# Patient Record
Sex: Female | Born: 1995 | Race: Black or African American | Hispanic: No | Marital: Single | Smoking: Never smoker
Health system: Southern US, Community
[De-identification: ages and names within clinical notes are randomized; demographics above are authoritative.]

## PROBLEM LIST (undated history)

## (undated) DIAGNOSIS — G43909 Migraine, unspecified, not intractable, without status migrainosus: Secondary | ICD-10-CM

---

## 2017-10-12 ENCOUNTER — Encounter (HOSPITAL_COMMUNITY): Payer: Self-pay | Admitting: Emergency Medicine

## 2017-10-12 ENCOUNTER — Ambulatory Visit (HOSPITAL_COMMUNITY)
Admission: EM | Admit: 2017-10-12 | Discharge: 2017-10-12 | Disposition: A | Discharge status: Home / Self Care | Payer: Medicaid Other | Attending: Family Medicine | Admitting: Family Medicine

## 2017-10-12 DIAGNOSIS — R110 Nausea: Secondary | ICD-10-CM | POA: Diagnosis not present

## 2017-10-12 DIAGNOSIS — G43719 Chronic migraine without aura, intractable, without status migrainosus: Secondary | ICD-10-CM | POA: Diagnosis not present

## 2017-10-12 DIAGNOSIS — Z760 Encounter for issue of repeat prescription: Secondary | ICD-10-CM | POA: Diagnosis not present

## 2017-10-12 HISTORY — DX: Migraine, unspecified, not intractable, without status migrainosus: G43.909

## 2017-10-12 MED ORDER — ONDANSETRON HCL 4 MG PO TABS: 4.0000 mg | ORAL_TABLET | Freq: Four times a day (QID) | ORAL | 0 refills | Status: DC

## 2017-10-12 MED ORDER — ELETRIPTAN HYDROBROMIDE 20 MG PO TABS: 20.0000 mg | ORAL_TABLET | ORAL | 0 refills | Status: DC | PRN

## 2017-10-12 MED ORDER — AMITRIPTYLINE HCL 25 MG PO TABS: 25.0000 mg | ORAL_TABLET | Freq: Every day | ORAL | 0 refills | Status: AC

## 2017-10-12 MED ORDER — KETOROLAC TROMETHAMINE 60 MG/2ML IM SOLN
INTRAMUSCULAR | Status: AC
  Filled 2017-10-12: qty 2

## 2017-10-12 MED ORDER — CYCLOBENZAPRINE HCL 5 MG PO TABS: 5.0000 mg | ORAL_TABLET | Freq: Three times a day (TID) | ORAL | 0 refills | Status: AC | PRN

## 2017-10-12 MED ORDER — KETOROLAC TROMETHAMINE 60 MG/2ML IM SOLN
60.0000 mg | Freq: Once | INTRAMUSCULAR | Status: AC
  Administered 2017-10-12: 60 mg via INTRAMUSCULAR

## 2017-10-12 NOTE — ED Provider Notes (Signed)
[facility]    CSN: [id number] Arrival date & time: 10/12/17  1114     History   Chief Complaint Chief Complaint  Patient presents with  . Headache    HPI Toni Ruiz is a 22 y.o. female.   HPI  Patient is here for migraine headache. She has a history of headache medicine for many years.  [facility].  She is been to neurology.  She is been tried on a variety of medicines.  She is unsure of the names of her medicines.  I did look at her [facility] records and was able to locate a couple of them.  She does know that amitriptyline at bedtime was helpful for her.  She does know that a mild muscle relaxer with ibuprofen will sometimes help her.  She does know that she needs a nausea medicine.  Unsure which of the headache prevention medicines worked well for her. This headaches been present off and on for the last couple of days.  She is photophobic.  Noise sensitive.  Headache across her forehead.  No aura.  Nausea but no vomiting.  No head injury.  No infection symptoms.  No numbness or weakness in arms or legs.   Past Medical History:  Diagnosis Date  . Migraine     There are no active problems to display for this patient.   Past Surgical History:  Procedure Laterality Date  . CESAREAN SECTION      OB History   None      Home Medications    Prior to Admission medications   Medication Sig Start Date End Date Taking? Authorizing Provider  amitriptyline (ELAVIL) 25 MG tablet Take 1 tablet (25 mg total) by mouth at bedtime. 10/12/17   Eustace MooreNelson, Aryelle Figg Sue, MD  cyclobenzaprine (FLEXERIL) 5 MG tablet Take 1 tablet (5 mg total) by mouth 3 (three) times daily as needed for muscle spasms. 10/12/17   Eustace MooreNelson, Noach Calvillo Sue, MD  eletriptan (RELPAX) 20 MG tablet Take 1 tablet (20 mg total) by mouth as needed for migraine or headache. May repeat in 2 hours if headache persists or recurs. 10/12/17   Eustace MooreNelson, Avalyn Molino Sue, MD  ondansetron (ZOFRAN) 4 MG tablet Take 1 tablet  (4 mg total) by mouth every 6 (six) hours. 10/12/17   Eustace MooreNelson, Mailey Landstrom Sue, MD    Family History No family history on file. No family history of migraine per patient Social History Social History   Tobacco Use  . Smoking status: Never Smoker  Substance Use Topics  . Alcohol use: Yes    Frequency: Never  . Drug use: Never     Allergies   Aspirin   Review of Systems Review of Systems  Constitutional: Negative for chills and fever.  HENT: Negative for ear pain and sore throat.   Eyes: Positive for photophobia. Negative for pain and visual disturbance.  Respiratory: Negative for cough and shortness of breath.   Cardiovascular: Negative for chest pain and palpitations.  Gastrointestinal: Positive for nausea. Negative for abdominal pain and vomiting.  Genitourinary: Negative for dysuria and hematuria.  Musculoskeletal: Negative for arthralgias and back pain.  Skin: Negative for color change and rash.  Neurological: Positive for headaches. Negative for seizures and syncope.  All other systems reviewed and are negative.    Physical Exam Triage Vital Signs ED Triage Vitals [10/12/17 1127]  Enc Vitals Group     BP 128/70     Pulse Rate 74     Resp 16  Temp 98.2 F (36.8 C)     Temp src      SpO2 100 %     Weight      Height      Head Circumference      Peak Flow      Pain Score      Pain Loc      Pain Edu?      Excl. in GC?    No data found.  Updated Vital Signs BP 128/70   Pulse 74   Temp 98.2 F (36.8 C)   Resp 16   LMP 09/16/2017   SpO2 100%   Visual Acuity Right Eye Distance:   Left Eye Distance:   Bilateral Distance:    Right Eye Near:   Left Eye Near:    Bilateral Near:     Physical Exam  Constitutional: She is oriented to person, place, and time. She appears well-developed and well-nourished. She appears ill. No distress.  Appears uncomfortable.  HENT:  Head: Normocephalic and atraumatic.  Mouth/Throat: Oropharynx is clear and moist.    Eyes: Pupils are equal, round, and reactive to light. Conjunctivae and EOM are normal.  Pupils reactive.  Disks are flat  Neck: Normal range of motion. No neck rigidity.  Cardiovascular: Normal rate and normal heart sounds.  No murmur heard. Pulmonary/Chest: Effort normal and breath sounds normal. No respiratory distress.  Abdominal: Soft. She exhibits no distension.  Musculoskeletal: Normal range of motion. She exhibits no edema.  Lymphadenopathy:    She has no cervical adenopathy.  Neurological: She is alert and oriented to person, place, and time. She displays normal reflexes. Coordination and gait normal.  Skin: Skin is warm and dry.  Psychiatric: She has a normal mood and affect. Her behavior is normal.     UC Treatments / Results  Labs (all labs ordered are listed, but only abnormal results are displayed) Labs Reviewed - No data to display  EKG None  Radiology No results found.  Procedures Procedures (including critical care time)  Medications Ordered in UC Medications  ketorolac (TORADOL) injection 60 mg (has no administration in time range)    Initial Impression / Assessment and Plan / UC Course  I have reviewed the triage vital signs and the nursing notes.  Pertinent labs & imaging results that were available during my care of the patient were reviewed by me and considered in my medical decision making (see chart for details).     History of migraines.  Normal neurologic exam.  We will refill her medications and refer to neurology for follow-up. Final Clinical Impressions(s) / UC Diagnoses   Final diagnoses:  Intractable chronic migraine without aura and without status migrainosus     Discharge Instructions     You need to call the neurologist today to set up an appointment.  Tell them you are referred by the urgent care center. We have given you a shot of Toradol for the pain. I have also gave her Zofran to take as needed for nausea.  I have refilled  your amitriptyline to take at bedtime.  This is to help prevent future headache I have prescribed a migraine pill to take it first sign of migraine You also need to establish with a primary care physician   ED Prescriptions    Medication Sig Dispense Auth. Provider   ondansetron (ZOFRAN) 4 MG tablet Take 1 tablet (4 mg total) by mouth every 6 (six) hours. 12 tablet Eustace Moore, MD  cyclobenzaprine (FLEXERIL) 5 MG tablet Take 1 tablet (5 mg total) by mouth 3 (three) times daily as needed for muscle spasms. 30 tablet Eustace Moore, MD   amitriptyline (ELAVIL) 25 MG tablet Take 1 tablet (25 mg total) by mouth at bedtime. 30 tablet Eustace Moore, MD   eletriptan (RELPAX) 20 MG tablet Take 1 tablet (20 mg total) by mouth as needed for migraine or headache. May repeat in 2 hours if headache persists or recurs. 10 tablet Eustace Moore, MD     Controlled Substance Prescriptions [place] Controlled Substance Registry consulted? Not Applicable   Eustace Moore, MD 10/12/17 [phone]

## 2017-10-12 NOTE — ED Triage Notes (Signed)
Pt c/o headache for the last few days, recently moved here, doesn't remember the meds she takes for her migraine.

## 2017-10-12 NOTE — Discharge Instructions (Signed)
You need to call the neurologist today to set up an appointment.  Tell them you are referred by the urgent care center. We have given you a shot of Toradol for the pain. I have also gave her Zofran to take as needed for nausea.  I have refilled your amitriptyline to take at bedtime.  This is to help prevent future headache I have prescribed a migraine pill to take it first sign of migraine You also need to establish with a primary care physician

## 2017-11-04 ENCOUNTER — Ambulatory Visit (HOSPITAL_COMMUNITY)
Admission: EM | Admit: 2017-11-04 | Discharge: 2017-11-04 | Disposition: A | Discharge status: Home / Self Care | Payer: Medicaid Other | Attending: Family Medicine | Admitting: Family Medicine

## 2017-11-04 ENCOUNTER — Other Ambulatory Visit: Payer: Self-pay

## 2017-11-04 ENCOUNTER — Encounter (HOSPITAL_COMMUNITY): Payer: Self-pay | Admitting: Emergency Medicine

## 2017-11-04 DIAGNOSIS — Z711 Person with feared health complaint in whom no diagnosis is made: Secondary | ICD-10-CM | POA: Diagnosis not present

## 2017-11-04 DIAGNOSIS — G43909 Migraine, unspecified, not intractable, without status migrainosus: Secondary | ICD-10-CM | POA: Insufficient documentation

## 2017-11-04 DIAGNOSIS — J029 Acute pharyngitis, unspecified: Secondary | ICD-10-CM

## 2017-11-04 DIAGNOSIS — Z79899 Other long term (current) drug therapy: Secondary | ICD-10-CM | POA: Insufficient documentation

## 2017-11-04 DIAGNOSIS — Z113 Encounter for screening for infections with a predominantly sexual mode of transmission: Secondary | ICD-10-CM | POA: Diagnosis not present

## 2017-11-04 NOTE — Discharge Instructions (Addendum)
Declines treatment today.   Vaginal self-swab obtained.  Oral swab obtained HIV/ syphilis testing today If tests are positive, please abstain from sexual activity for at least 7 days and notify partners PCP assistance initiated  Follow up with PCP if symptoms persists Return here or go to ER if you have any new or worsening symptoms

## 2017-11-04 NOTE — ED Provider Notes (Signed)
[facility]   [id number] 11/04/17 Arrival Time: 1056  Cc: sore throat  SUBJECTIVE: History from: patient.  Toni Ruiz is a 22 y.o. female who presents with abrupt onset of sore throat for x 4 days.  Denies positive sick exposure or precipitating event.  Worried for oral STD, but denies oral sex.  Has not tried OTC medications.  Denies alleviating or aggravating factors.  Denies previous symptoms in the past.   Denies fever, chills, fatigue, sinus pain, rhinorrhea, cough, SOB, wheezing, chest pain, nausea, rash, changes in bowel or bladder habits.    Patient also requests STD testing.  Last protected sex in 10/03/17.  Last unprotected sex greater than 1 year ago.  Currrently asymptomatic.  1 female female partner.  Partner without symptoms.  Denies vaginal discharge, vaginal odor, vaginal pain, or urinary symptoms.     ROS: As per HPI.  Past Medical History:  Diagnosis Date  . Migraine    Past Surgical History:  Procedure Laterality Date  . CESAREAN SECTION     Allergies  Allergen Reactions  . Aspirin    No current facility-administered medications on file prior to encounter.    Current Outpatient Medications on File Prior to Encounter  Medication Sig Dispense Refill  . amitriptyline (ELAVIL) 25 MG tablet Take 1 tablet (25 mg total) by mouth at bedtime. 30 tablet 0  . cyclobenzaprine (FLEXERIL) 5 MG tablet Take 1 tablet (5 mg total) by mouth 3 (three) times daily as needed for muscle spasms. 30 tablet 0  . eletriptan (RELPAX) 20 MG tablet Take 1 tablet (20 mg total) by mouth as needed for migraine or headache. May repeat in 2 hours if headache persists or recurs. 10 tablet 0  . ondansetron (ZOFRAN) 4 MG tablet Take 1 tablet (4 mg total) by mouth every 6 (six) hours. 12 tablet 0   Social History   Socioeconomic History  . Marital status: Single    Spouse name: Not on file  . Number of children: Not on file  . Years of education: Not on file  . Highest education level:  Not on file  Occupational History  . Not on file  Social Needs  . Financial resource strain: Not on file  . Food insecurity:    Worry: Not on file    Inability: Not on file  . Transportation needs:    Medical: Not on file    Non-medical: Not on file  Tobacco Use  . Smoking status: Never Smoker  . Smokeless tobacco: Never Used  Substance and Sexual Activity  . Alcohol use: Yes    Frequency: Never  . Drug use: Never  . Sexual activity: Not on file  Lifestyle  . Physical activity:    Days per week: Not on file    Minutes per session: Not on file  . Stress: Not on file  Relationships  . Social connections:    Talks on phone: Not on file    Gets together: Not on file    Attends religious service: Not on file    Active member of club or organization: Not on file    Attends meetings of clubs or organizations: Not on file    Relationship status: Not on file  . Intimate partner violence:    Fear of current or ex partner: Not on file    Emotionally abused: Not on file    Physically abused: Not on file    Forced sexual activity: Not on file  Other Topics Concern  .  Not on file  Social History Narrative  . Not on file   History reviewed. No pertinent family history.  OBJECTIVE:  Vitals:   11/04/17 1121  BP: 110/65  Pulse: 78  Temp: 97.9 F (36.6 C)  TempSrc: Oral  SpO2: 100%     General appearance: alert; oriented; nontoxic appearance  HEENT: Ears: EACs clear, TMs pearly gray with visible cone of light, without erythema; Eyes: PERRL, EOMI grossly; Sinuses nontender to palpation; Nose: no obvious rhinorrhea; Throat: oropharynx clear, tonsils not enlarged or erythematous, uvula midline Lungs: CTA bilaterally without adventitious breath sounds Heart: regular rate and rhythm.  Radial pulses 2+ symmetrical bilaterally Abdomen: soft, non-distended; normal active bowel sounds; non-tender to light or deep palpation; nontender over mcburneys point; no guarding GU:  Deferrred Skin: warm and dry Psychological: alert and cooperative; anxious mood and affect  ASSESSMENT & PLAN:  1. Concern about STD in female without diagnosis   2. Sore throat     No orders of the defined types were placed in this encounter.  Declines treatment today.   Vaginal self-swab obtained.  Oral swab obtained HIV/ syphilis testing today If tests are positive, please abstain from sexual activity for at least 7 days and notify partners PCP assistance initiated  Follow up with PCP if symptoms persists Return here or go to ER if you have any new or worsening symptoms     Reviewed expectations re: course of current medical issues. Questions answered. Outlined signs and symptoms indicating need for more acute intervention. Patient verbalized understanding. After Visit Summary given.        Rennis Harding, PA-C 11/04/17 1252

## 2017-11-04 NOTE — ED Triage Notes (Signed)
Pt reports a sore throat and her mouth "feeling funny" x4 days.  She denies any fever.

## 2017-11-05 LAB — CERVICOVAGINAL ANCILLARY ONLY
Bacterial vaginitis: POSITIVE — AB
CHLAMYDIA, DNA PROBE: NEGATIVE
Candida vaginitis: POSITIVE — AB
NEISSERIA GONORRHEA: NEGATIVE
TRICH (WINDOWPATH): NEGATIVE

## 2017-11-05 LAB — CYTOLOGY, (ORAL, ANAL, URETHRAL) ANCILLARY ONLY
CHLAMYDIA, DNA PROBE: NEGATIVE
NEISSERIA GONORRHEA: NEGATIVE

## 2017-11-05 LAB — RPR: RPR Ser Ql: NONREACTIVE

## 2017-11-05 LAB — HIV ANTIBODY (ROUTINE TESTING W REFLEX): HIV SCREEN 4TH GENERATION: NONREACTIVE

## 2017-11-06 ENCOUNTER — Telehealth (HOSPITAL_COMMUNITY): Payer: Self-pay

## 2017-11-06 MED ORDER — FLUCONAZOLE 150 MG PO TABS: 150.0000 mg | ORAL_TABLET | Freq: Every day | ORAL | 0 refills | Status: AC

## 2017-11-06 MED ORDER — METRONIDAZOLE 500 MG PO TABS: 500.0000 mg | ORAL_TABLET | Freq: Two times a day (BID) | ORAL | 0 refills | Status: DC

## 2017-11-06 NOTE — Telephone Encounter (Signed)
Bacterial vaginosis is positive. This was not treated at the urgent care visit.  Patient complains of persistent symptoms.  Flagyl 500 mg BID x 7 days #14 no refills sent to patients pharmacy of choice per Dr. Murray.  Pt called and made aware of results and new prescription. Answered all questions and pt verbalized understanding.   Pt contacted regarding test for candida (yeast) was positive.  Prescription for fluconazole 150mg po now, repeat dose in 3d if needed, #2 no refills, sent to the pharmacy of record.  Recheck or followup with PCP for further evaluation if symptoms are not improving.  Answered all questions. 

## 2017-12-05 ENCOUNTER — Other Ambulatory Visit: Payer: Self-pay | Admitting: Family Medicine

## 2017-12-10 ENCOUNTER — Encounter (HOSPITAL_COMMUNITY): Payer: Self-pay | Admitting: Emergency Medicine

## 2017-12-10 ENCOUNTER — Other Ambulatory Visit: Payer: Self-pay

## 2017-12-10 ENCOUNTER — Emergency Department (HOSPITAL_COMMUNITY): Payer: Medicaid Other

## 2017-12-10 ENCOUNTER — Emergency Department (HOSPITAL_COMMUNITY)
Admission: EM | Admit: 2017-12-10 | Discharge: 2017-12-11 | Disposition: A | Discharge status: Home / Self Care | Payer: Medicaid Other | Attending: Emergency Medicine | Admitting: Emergency Medicine

## 2017-12-10 DIAGNOSIS — R0602 Shortness of breath: Secondary | ICD-10-CM | POA: Diagnosis not present

## 2017-12-10 DIAGNOSIS — R0789 Other chest pain: Secondary | ICD-10-CM | POA: Diagnosis present

## 2017-12-10 DIAGNOSIS — R112 Nausea with vomiting, unspecified: Secondary | ICD-10-CM | POA: Diagnosis not present

## 2017-12-10 DIAGNOSIS — R42 Dizziness and giddiness: Secondary | ICD-10-CM | POA: Diagnosis not present

## 2017-12-10 DIAGNOSIS — Z5321 Procedure and treatment not carried out due to patient leaving prior to being seen by health care provider: Secondary | ICD-10-CM | POA: Insufficient documentation

## 2017-12-10 LAB — BASIC METABOLIC PANEL
ANION GAP: 8 (ref 5–15)
BUN: 10 mg/dL (ref 6–20)
CALCIUM: 9.3 mg/dL (ref 8.9–10.3)
CO2: 24 mmol/L (ref 22–32)
Chloride: 105 mmol/L (ref 98–111)
Creatinine, Ser: 0.75 mg/dL (ref 0.44–1.00)
GFR calc Af Amer: >60 mL/min (ref >60)
GLUCOSE: 105 mg/dL — AB (ref 70–99)
POTASSIUM: 3.9 mmol/L (ref 3.5–5.1)
Sodium: 137 mmol/L (ref 135–145)

## 2017-12-10 LAB — I-STAT TROPONIN, ED: TROPONIN I, POC: 0 ng/mL (ref 0.00–0.08)

## 2017-12-10 LAB — CBC
HCT: 38.4 % (ref 36.0–46.0)
HEMOGLOBIN: 11.8 g/dL — AB (ref 12.0–15.0)
MCH: 23.3 pg — ABNORMAL LOW (ref 26.0–34.0)
MCHC: 30.7 g/dL (ref 30.0–36.0)
MCV: 75.7 fL — ABNORMAL LOW (ref 78.0–100.0)
Platelets: 213 10*3/uL (ref 150–400)
RBC: 5.07 MIL/uL (ref 3.87–5.11)
RDW: 14.4 % (ref 11.5–15.5)
WBC: 6.2 10*3/uL (ref 4.0–10.5)

## 2017-12-10 LAB — I-STAT BETA HCG BLOOD, ED (MC, WL, AP ONLY)

## 2017-12-10 NOTE — ED Triage Notes (Signed)
Pt c/o left sided chest pain, shortness of breath, nausea/vomiting, and dizziness x 5 days.

## 2017-12-11 ENCOUNTER — Encounter (HOSPITAL_COMMUNITY): Payer: Self-pay

## 2017-12-11 ENCOUNTER — Ambulatory Visit (HOSPITAL_COMMUNITY)
Admission: EM | Admit: 2017-12-11 | Discharge: 2017-12-11 | Disposition: A | Discharge status: Home / Self Care | Payer: Medicaid Other | Attending: Emergency Medicine | Admitting: Emergency Medicine

## 2017-12-11 DIAGNOSIS — R42 Dizziness and giddiness: Secondary | ICD-10-CM | POA: Diagnosis not present

## 2017-12-11 DIAGNOSIS — M7918 Myalgia, other site: Secondary | ICD-10-CM

## 2017-12-11 DIAGNOSIS — M791 Myalgia, unspecified site: Secondary | ICD-10-CM | POA: Diagnosis not present

## 2017-12-11 DIAGNOSIS — R0789 Other chest pain: Secondary | ICD-10-CM

## 2017-12-11 MED ORDER — DEXAMETHASONE SODIUM PHOSPHATE 10 MG/ML IJ SOLN
INTRAMUSCULAR | Status: AC
  Filled 2017-12-11: qty 1

## 2017-12-11 MED ORDER — METHYLPREDNISOLONE 4 MG PO TBPK: ORAL_TABLET | ORAL | 0 refills | Status: AC

## 2017-12-11 MED ORDER — KETOROLAC TROMETHAMINE 60 MG/2ML IM SOLN
INTRAMUSCULAR | Status: AC
  Filled 2017-12-11: qty 2

## 2017-12-11 MED ORDER — DEXAMETHASONE SODIUM PHOSPHATE 10 MG/ML IJ SOLN
10.0000 mg | Freq: Once | INTRAMUSCULAR | Status: AC
  Administered 2017-12-11: 10 mg via INTRAMUSCULAR

## 2017-12-11 MED ORDER — DICLOFENAC SODIUM 50 MG PO TBEC: 50.0000 mg | DELAYED_RELEASE_TABLET | Freq: Three times a day (TID) | ORAL | 0 refills | Status: DC | PRN

## 2017-12-11 MED ORDER — KETOROLAC TROMETHAMINE 60 MG/2ML IM SOLN
60.0000 mg | Freq: Once | INTRAMUSCULAR | Status: AC
  Administered 2017-12-11: 60 mg via INTRAMUSCULAR

## 2017-12-11 NOTE — ED Triage Notes (Signed)
Pt presents with generalized chest pain that radiates around to her back and also makes her left side hurt and have weakness.

## 2017-12-11 NOTE — ED Provider Notes (Signed)
[facility]    CSN: [id number] Arrival date & time: 12/11/17  0847     History   Chief Complaint Chief Complaint  Patient presents with  . Chest Pain    HPI Patches Slatten is a 22 y.o. female.   Subjective:  Marypat Kimmet is a 22 y.o. female who presents for evaluation of chest wall pain, upper back pain and left-sided neck pain. Onset was 5-6 days ago. Symptoms have been worsening since that time. The patient describes the pain as aching and stabbing in the entire chest. Patient rates pain as a 10/10 in intensity. Associated symptoms are: none. Aggravating factors are: coughing, deep inspiration, lifting, palpation of chest and walking. Alleviating factors are: none. Mechanism of injury: none known. Patient reports that she has had this happen before. She was prescribed medications in the past but no longer takes them and doesn't know the name(s) of it either.  She has taken Flexeril and over-the-counter analgesics without any relief in her symptoms.  Notably, the patient presented to the ED last night for the same complaints. She underwent EKG, CXR and blood work but left prior to being seen by a provider.   The following portions of the patient's history were reviewed and updated as appropriate: allergies, current medications, past family history, past medical history, past social history, past surgical history and problem list.       Past Medical History:  Diagnosis Date  . Migraine     There are no active problems to display for this patient.   Past Surgical History:  Procedure Laterality Date  . CESAREAN SECTION      OB History   None      Home Medications    Prior to Admission medications   Medication Sig Start Date End Date Taking? Authorizing Provider  amitriptyline (ELAVIL) 25 MG tablet Take 1 tablet (25 mg total) by mouth at bedtime. 10/12/17   Eustace Moore, MD  cyclobenzaprine (FLEXERIL) 5 MG tablet Take 1 tablet (5 mg total) by mouth 3  (three) times daily as needed for muscle spasms. 10/12/17   Eustace Moore, MD  diclofenac (VOLTAREN) 50 MG EC tablet Take 1 tablet (50 mg total) by mouth 3 (three) times daily as needed. 12/11/17   Lurline Idol, FNP  eletriptan (RELPAX) 20 MG tablet Take 1 tablet (20 mg total) by mouth as needed for migraine or headache. May repeat in 2 hours if headache persists or recurs. 10/12/17   Eustace Moore, MD  methylPREDNISolone (MEDROL DOSEPAK) 4 MG TBPK tablet Take as directed 12/11/17   Lurline Idol, FNP  metroNIDAZOLE (FLAGYL) 500 MG tablet Take 1 tablet (500 mg total) by mouth 2 (two) times daily. 11/06/17   Eustace Moore, MD  ondansetron (ZOFRAN) 4 MG tablet Take 1 tablet (4 mg total) by mouth every 6 (six) hours. 10/12/17   Eustace Moore, MD    Family History History reviewed. No pertinent family history.  Social History Social History   Tobacco Use  . Smoking status: Never Smoker  . Smokeless tobacco: Never Used  Substance Use Topics  . Alcohol use: Yes    Frequency: Never  . Drug use: Never     Allergies   Aspirin   Review of Systems Review of Systems  Respiratory: Positive for chest tightness and shortness of breath.   Cardiovascular: Positive for chest pain.  Gastrointestinal: Positive for nausea and vomiting.  Musculoskeletal: Positive for neck pain.  Neurological: Positive for dizziness and  headaches.  All other systems reviewed and are negative.    Physical Exam Triage Vital Signs ED Triage Vitals [12/11/17 0920]  Enc Vitals Group     BP 109/63     Pulse Rate (!) 181     Resp 14     Temp 98.2 F (36.8 C)     Temp Source Oral     SpO2 99 %     Weight      Height      Head Circumference      Peak Flow      Pain Score      Pain Loc      Pain Edu?      Excl. in GC?    No data found.  Updated Vital Signs BP 109/63 (BP Location: Left Arm)   Pulse (!) 181   Temp 98.2 F (36.8 C) (Oral)   Resp 14   LMP 11/11/2017   SpO2 99%    Visual Acuity Right Eye Distance:   Left Eye Distance:   Bilateral Distance:    Right Eye Near:   Left Eye Near:    Bilateral Near:     Physical Exam  Constitutional: She is oriented to person, place, and time. She appears well-developed and well-nourished.  HENT:  Head: Normocephalic.  Eyes: Pupils are equal, round, and reactive to light. EOM are normal.  Neck: Normal range of motion. Neck supple.  Cardiovascular: Normal rate and regular rhythm.  Pulmonary/Chest: Effort normal and breath sounds normal.  Musculoskeletal: Normal range of motion.  Neurological: She is alert and oriented to person, place, and time. No cranial nerve deficit.  Skin: Skin is warm and dry.  Nursing note and vitals reviewed.    UC Treatments / Results  Labs (all labs ordered are listed, but only abnormal results are displayed) Labs Reviewed - No data to display  EKG None  Radiology Dg Chest 2 View  Result Date: 12/10/2017 CLINICAL DATA:  Chest pain for 5 days EXAM: CHEST - 2 VIEW COMPARISON:  None. FINDINGS: Normal heart size. Lungs clear. No pneumothorax. No pleural effusion. IMPRESSION: No active cardiopulmonary disease. Electronically Signed   By: Jolaine ClickArthur  Hoss M.D.   On: 12/10/2017 22:04    Procedures Procedures (including critical care time)  Medications Ordered in UC Medications  ketorolac (TORADOL) injection 60 mg (60 mg Intramuscular Given 12/11/17 1017)  dexamethasone (DECADRON) injection 10 mg (10 mg Intramuscular Given 12/11/17 1018)    Initial Impression / Assessment and Plan / UC Course  I have reviewed the triage vital signs and the nursing notes.  Pertinent labs & imaging results that were available during my care of the patient were reviewed by me and considered in my medical decision making (see chart for details).    22 year old female presenting with a 5 to 6-day history of chest wall pain, neck pain and back pain.  Denies any trauma or injury. Patient has a diffusely  positive review of systems.  Physical exam positive for chest wall tenderness, left lateral neck tenderness and left upper back tenderness.  No meningism signs or other focal findings are noted.  Patient presented to the ED last night for similar complaints.  Underwent an EKG and blood work but left prior to being seen by provider.  EKG done in the ED showed normal sinus rhythm.  CXR unremarkable. Troponin, CBC and CMP were also unremarkable.  EKG done in the department today revealed a normal sinus rhythm.  Vital signs stable.  Symptoms  likely musculoskeletal in nature.  Will treat with steroids and NSAIDs.  Patient given Toradol 60 mg IM and Decadron 10 mg IM in the clinic.  Prescribed a Medrol Dosepak as well as diclofenac for pain.  Advised patient to follow with her PCP if no improvement after completing the prescribed therapies.  Today's evaluation has revealed no signs of a dangerous process. Discussed diagnosis with patient. Patient aware of their diagnosis, possible red flag symptoms to watch out for and need for close follow up. Patient understands verbal and written discharge instructions. Patient comfortable with plan and disposition.  Patient has a clear mental status at this time, good insight into illness (after discussion and teaching) and has clear judgment to make decisions regarding their care.  Documentation was completed with the aid of voice recognition software. Transcription may contain typographical errors.  Final Clinical Impressions(s) / UC Diagnoses   Final diagnoses:  Chest wall pain  Musculoskeletal pain     Discharge Instructions     Take medications as directed. Follow-up with your primary care doctor if no improvement after completing medications. Go to ED if worse.     ED Prescriptions    Medication Sig Dispense Auth. Provider   methylPREDNISolone (MEDROL DOSEPAK) 4 MG TBPK tablet Take as directed 21 tablet Lurline Idol, FNP   diclofenac (VOLTAREN) 50 MG  EC tablet Take 1 tablet (50 mg total) by mouth 3 (three) times daily as needed. 21 tablet Lurline Idol, FNP     Controlled Substance Prescriptions [place] Controlled Substance Registry consulted? Not Applicable   Lurline Idol, FNP 12/11/17 1024

## 2017-12-11 NOTE — ED Notes (Signed)
No answer for vitals recheck x1 

## 2017-12-11 NOTE — Discharge Instructions (Signed)
Take medications as directed. Follow-up with your primary care doctor if no improvement after completing medications. Go to ED if worse.

## 2017-12-11 NOTE — ED Notes (Signed)
No answer for vitals recheck x3 

## 2017-12-11 NOTE — ED Notes (Signed)
No answer for vitals recheck x2 

## 2017-12-11 NOTE — ED Notes (Signed)
Pt no longer in waiting area.  

## 2017-12-14 ENCOUNTER — Emergency Department (HOSPITAL_COMMUNITY): Payer: Medicaid Other

## 2017-12-14 ENCOUNTER — Other Ambulatory Visit: Payer: Self-pay

## 2017-12-14 ENCOUNTER — Emergency Department (HOSPITAL_COMMUNITY)
Admission: EM | Admit: 2017-12-14 | Discharge: 2017-12-15 | Disposition: A | Discharge status: Home / Self Care | Payer: Medicaid Other | Attending: Emergency Medicine | Admitting: Emergency Medicine

## 2017-12-14 DIAGNOSIS — L02412 Cutaneous abscess of left axilla: Secondary | ICD-10-CM | POA: Diagnosis not present

## 2017-12-14 DIAGNOSIS — Z79899 Other long term (current) drug therapy: Secondary | ICD-10-CM | POA: Diagnosis not present

## 2017-12-14 DIAGNOSIS — R0789 Other chest pain: Secondary | ICD-10-CM | POA: Diagnosis not present

## 2017-12-14 LAB — BASIC METABOLIC PANEL
Anion gap: 8 (ref 5–15)
BUN: 9 mg/dL (ref 6–20)
CO2: 27 mmol/L (ref 22–32)
Calcium: 8.9 mg/dL (ref 8.9–10.3)
Chloride: 105 mmol/L (ref 98–111)
Creatinine, Ser: 0.73 mg/dL (ref 0.44–1.00)
GFR calc Af Amer: >60 mL/min (ref >60)
GFR calc non Af Amer: >60 mL/min (ref >60)
Glucose, Bld: 107 mg/dL — ABNORMAL HIGH (ref 70–99)
Potassium: 3.3 mmol/L — ABNORMAL LOW (ref 3.5–5.1)
Sodium: 140 mmol/L (ref 135–145)

## 2017-12-14 LAB — I-STAT BETA HCG BLOOD, ED (MC, WL, AP ONLY): I-stat hCG, quantitative: <5 m[IU]/mL (ref <5)

## 2017-12-14 LAB — CBC
HCT: 36.1 % (ref 36.0–46.0)
Hemoglobin: 11.2 g/dL — ABNORMAL LOW (ref 12.0–15.0)
MCH: 23.5 pg — AB (ref 26.0–34.0)
MCHC: 31 g/dL (ref 30.0–36.0)
MCV: 75.8 fL — AB (ref 78.0–100.0)
PLATELETS: 219 10*3/uL (ref 150–400)
RBC: 4.76 MIL/uL (ref 3.87–5.11)
RDW: 14.4 % (ref 11.5–15.5)
WBC: 9.1 10*3/uL (ref 4.0–10.5)

## 2017-12-14 LAB — I-STAT TROPONIN, ED: Troponin i, poc: 0 ng/mL (ref 0.00–0.08)

## 2017-12-14 NOTE — ED Triage Notes (Signed)
Patient c/o CP (has been seen for same). Also c/o entire left side pain and difficulty breathing. Patient is in NAD at this time.

## 2017-12-14 NOTE — ED Notes (Signed)
Patient wanted to know if she could have some pain medication while waiting in the lobby; patient advised that we will wait for provider to assess once she gets to a room.

## 2017-12-15 ENCOUNTER — Emergency Department (HOSPITAL_COMMUNITY): Payer: Medicaid Other

## 2017-12-15 LAB — D-DIMER, QUANTITATIVE: D-Dimer, Quant: 0.85 ug/mL-FEU — ABNORMAL HIGH (ref 0.00–0.50)

## 2017-12-15 MED ORDER — ACETAMINOPHEN 325 MG PO TABS
650.0000 mg | ORAL_TABLET | Freq: Once | ORAL | Status: DC
  Filled 2017-12-15: qty 2

## 2017-12-15 MED ORDER — NAPROXEN 500 MG PO TABS: 500.0000 mg | ORAL_TABLET | Freq: Two times a day (BID) | ORAL | 0 refills | Status: DC

## 2017-12-15 MED ORDER — IOPAMIDOL (ISOVUE-370) INJECTION 76%
INTRAVENOUS | Status: AC
  Filled 2017-12-15: qty 100

## 2017-12-15 MED ORDER — IBUPROFEN 400 MG PO TABS
600.0000 mg | ORAL_TABLET | Freq: Once | ORAL | Status: AC
  Administered 2017-12-15: 600 mg via ORAL
  Filled 2017-12-15: qty 1

## 2017-12-15 MED ORDER — KETOROLAC TROMETHAMINE 30 MG/ML IJ SOLN
30.0000 mg | Freq: Once | INTRAMUSCULAR | Status: AC
  Administered 2017-12-15: 30 mg via INTRAMUSCULAR
  Filled 2017-12-15: qty 1

## 2017-12-15 MED ORDER — IOPAMIDOL (ISOVUE-370) INJECTION 76%
100.0000 mL | Freq: Once | INTRAVENOUS | Status: AC | PRN
  Administered 2017-12-15: 100 mL via INTRAVENOUS

## 2017-12-15 MED ORDER — LIDOCAINE-EPINEPHRINE (PF) 2 %-1:200000 IJ SOLN
20.0000 mL | Freq: Once | INTRAMUSCULAR | Status: AC
  Administered 2017-12-15: 20 mL
  Filled 2017-12-15: qty 20

## 2017-12-15 NOTE — Discharge Instructions (Addendum)
You were seen today for chest pain.  Your work-up is reassuring including a CT scan of your chest to rule out PE.  Switch to naproxen to see if this helps with pain.  Follow-up with your primary doctor.

## 2017-12-15 NOTE — ED Provider Notes (Signed)
[facility] EMERGENCY DEPARTMENT Provider Note   CSN: [id number] Arrival date & time: 12/14/17  2125     History   Chief Complaint Chief Complaint  Patient presents with  . Chest Pain    HPI Toni Ruiz is a 22 y.o. female.  HPI  This is a 22 year old female with no significant past medical history who presents with chest pain and "left side pain."  She states that she hurts all over.  She describes left-sided chest pain and pain underneath the breast which is worse with deep breathing.  She at times states that when she cannot catch her breath she has a nonproductive cough.  No fevers.  She has taken ibuprofen and a Medrol Dosepak with minimal relief.  She was seen and evaluated at urgent care.  At that time her pain was thought to be musculoskeletal nature.  She reports leg pain as well as swelling.  No history of blood clots.  No recent travel or hospitalization.  Currently she is unable to quantify her pain because "I have just been in pain all night."  Patient also states that she has noted an abscess under her left axilla which is become more painful.  No fevers or overlying skin changes.  Past Medical History:  Diagnosis Date  . Migraine     There are no active problems to display for this patient.   Past Surgical History:  Procedure Laterality Date  . CESAREAN SECTION       OB History   None      Home Medications    Prior to Admission medications   Medication Sig Start Date End Date Taking? Authorizing Provider  amitriptyline (ELAVIL) 25 MG tablet Take 1 tablet (25 mg total) by mouth at bedtime. 10/12/17  Yes Eustace Moore, MD  cyclobenzaprine (FLEXERIL) 5 MG tablet Take 1 tablet (5 mg total) by mouth 3 (three) times daily as needed for muscle spasms. 10/12/17  Yes Eustace Moore, MD  eletriptan (RELPAX) 20 MG tablet Take 1 tablet (20 mg total) by mouth as needed for migraine or headache. May repeat in 2 hours if headache persists or  recurs. Patient taking differently: Take 20 mg by mouth as needed for headache (for migraines and may repeat once in 2 hours, if no headache persists or recurs).  10/12/17  Yes Eustace Moore, MD  ibuprofen (ADVIL,MOTRIN) 800 MG tablet Take 800 mg by mouth every 8 (eight) hours as needed (for headaches or pain).    Yes [provider]  methylPREDNISolone (MEDROL DOSEPAK) 4 MG TBPK tablet Take as directed 12/11/17  Yes Lurline Idol, FNP  rizatriptan (MAXALT) 10 MG tablet Take 10 mg by mouth as needed (for migraines and may repeat once in 2 hours, if no relief).    Yes [provider]  metroNIDAZOLE (FLAGYL) 500 MG tablet Take 1 tablet (500 mg total) by mouth 2 (two) times daily. Patient not taking: Reported on 12/15/2017 11/06/17   Eustace Moore, MD  naproxen (NAPROSYN) 500 MG tablet Take 1 tablet (500 mg total) by mouth 2 (two) times daily. 12/15/17   Mohamud Mrozek, Mayer Masker, MD  ondansetron (ZOFRAN) 4 MG tablet Take 1 tablet (4 mg total) by mouth every 6 (six) hours. Patient not taking: Reported on 12/15/2017 10/12/17   Eustace Moore, MD    Family History No family history on file.  Social History Social History   Tobacco Use  . Smoking status: Never Smoker  . Smokeless tobacco:  Never Used  Substance Use Topics  . Alcohol use: Yes    Frequency: Never  . Drug use: Never     Allergies   Aspirin; Tylenol [acetaminophen]; and Latex   Review of Systems Review of Systems  Constitutional: Negative for fever.  Respiratory: Positive for cough. Negative for shortness of breath.   Cardiovascular: Positive for chest pain and leg swelling.  Gastrointestinal: Negative for abdominal pain, nausea and vomiting.  Genitourinary: Negative for dysuria.  Musculoskeletal: Negative for back pain.  All other systems reviewed and are negative.    Physical Exam Updated Vital Signs BP (!) 117/54   Pulse 73   Temp 98.8 F (37.1 C) (Oral)   Resp 17   Ht 1.651 m (5\' 5" )    Wt 57.6 kg   SpO2 100%   BMI 21.13 kg/m   Physical Exam  Constitutional: She is oriented to person, place, and time. She appears well-developed and well-nourished. No distress.  HENT:  Head: Normocephalic and atraumatic.  Neck: Neck supple.  Cardiovascular: Normal rate, regular rhythm, normal heart sounds and normal pulses.  Pulmonary/Chest: Effort normal. No respiratory distress. She has no wheezes.  Abdominal: Soft. Bowel sounds are normal.  Musculoskeletal:       Right lower leg: She exhibits tenderness. She exhibits no edema.       Left lower leg: She exhibits tenderness. She exhibits no edema.  Neurological: She is alert and oriented to person, place, and time.  Skin: Skin is warm and dry.  2 x 3 cm area of fluctuance left axilla, no significant overlying erythema  Psychiatric: She has a normal mood and affect.  Nursing note and vitals reviewed.    ED Treatments / Results  Labs (all labs ordered are listed, but only abnormal results are displayed) Labs Reviewed  BASIC METABOLIC PANEL - Abnormal; Notable for the following components:      Result Value   Potassium 3.3 (*)    Glucose, Bld 107 (*)    All other components within normal limits  CBC - Abnormal; Notable for the following components:   Hemoglobin 11.2 (*)    MCV 75.8 (*)    MCH 23.5 (*)    All other components within normal limits  D-DIMER, QUANTITATIVE (NOT AT [facility]) - Abnormal; Notable for the following components:   D-Dimer, Quant 0.85 (*)    All other components within normal limits  I-STAT TROPONIN, ED  I-STAT BETA HCG BLOOD, ED ([facility], [facility], [facility] ONLY)    EKG EKG Interpretation  Date/Time:  Monday December 14 2017 21:38:18 EDT Ventricular Rate:  94 PR Interval:  142 QRS Duration: 88 QT Interval:  354 QTC Calculation: 442 R Axis:   84 Text Interpretation:  Normal sinus rhythm Normal ECG Baseline wander Confirmed by Ross MarcusHorton, Monicka Cyran ([id number]) on 12/15/2017 4:19:07 AM   Radiology Dg Chest 2  View  Result Date: 12/14/2017 CLINICAL DATA:  Chest pain EXAM: CHEST - 2 VIEW COMPARISON:  12/10/2017 FINDINGS: The heart size and mediastinal contours are within normal limits. Both lungs are clear. The visualized skeletal structures are unremarkable. IMPRESSION: No active cardiopulmonary disease. Electronically Signed   By: Jasmine PangKim  Fujinaga M.D.   On: 12/14/2017 22:16   Ct Angio Chest Pe W And/or Wo Contrast  Result Date: 12/15/2017 CLINICAL DATA:  Positive D-dimer. EXAM: CT ANGIOGRAPHY CHEST WITH CONTRAST TECHNIQUE: Multidetector CT imaging of the chest was performed using the standard protocol during bolus administration of intravenous contrast. Multiplanar CT image reconstructions and MIPs were obtained to  evaluate the vascular anatomy. CONTRAST:  ISOVUE-370 IOPAMIDOL (ISOVUE-370) INJECTION 76% COMPARISON:  None. FINDINGS: Cardiovascular: There is good opacification of the central and segmental pulmonary arteries. No focal filling defects. No evidence of significant pulmonary embolus. Normal heart size. No pericardial effusion. Normal caliber thoracic aorta. Mediastinum/Nodes: Esophagus is decompressed. No significant mediastinal lymphadenopathy. Lungs/Pleura: Lungs are clear and expanded. No airspace disease or consolidation. No pleural effusions. No pneumothorax. Airways are patent. Upper Abdomen: No acute abnormality. Musculoskeletal: No chest wall abnormality. No acute or significant osseous findings. Review of the MIP images confirms the above findings. IMPRESSION: No evidence of significant pulmonary embolus. No evidence of active pulmonary disease. Electronically Signed   By: Burman Nieves M.D.   On: 12/15/2017 06:40    Procedures Procedures (including critical care time)  Medications Ordered in ED Medications  iopamidol (ISOVUE-370) 76 % injection (has no administration in time range)  acetaminophen (TYLENOL) tablet 650 mg (650 mg Oral Refused 12/15/17 0615)  lidocaine-EPINEPHrine  (XYLOCAINE W/EPI) 2 %-1:200000 (PF) injection 20 mL (20 mLs Infiltration Given 12/15/17 0443)  ketorolac (TORADOL) 30 MG/ML injection 30 mg (30 mg Intramuscular Given 12/15/17 0443)  iopamidol (ISOVUE-370) 76 % injection 100 mL (100 mLs Intravenous Contrast Given 12/15/17 0622)  ibuprofen (ADVIL,MOTRIN) tablet 600 mg (600 mg Oral Given 12/15/17 4098)     Initial Impression / Assessment and Plan / ED Course  I have reviewed the triage vital signs and the nursing notes.  Pertinent labs & imaging results that were available during my care of the patient were reviewed by me and considered in my medical decision making (see chart for details).     Presents with ongoing chest pain and left-sided pain.  She is overall nontoxic-appearing and vital signs are reassuring.  She is 100% on room air.  She is currently on a Medrol Dosepak as well as anti-inflammatories.  She is not in any acute distress.  Her exam is very atypical.  She is low risk for PE; however, given the pleuritic nature of pain, will send a screening d-dimer.  She denies any recent infectious symptoms or fevers.  Doubt pericarditis or myocarditis.  EKG is reassuring.  D-dimer is positive.  Patient sent for CT.  CT shows no pulmonary embolus and is otherwise normal.  Patient was reassured.  Will switch from diclofenac to naproxen and have her follow-up closely with her primary physician.  After history, exam, and medical workup I feel the patient has been appropriately medically screened and is safe for discharge home. Pertinent diagnoses were discussed with the patient. Patient was given return precautions.   Final Clinical Impressions(s) / ED Diagnoses   Final diagnoses:  Atypical chest pain    ED Discharge Orders         Ordered    naproxen (NAPROSYN) 500 MG tablet  2 times daily     12/15/17 0647           Jorah Hua, Mayer Masker, MD 12/15/17 [phone]

## 2017-12-15 NOTE — ED Provider Notes (Signed)
..  Incision and Drainage Date/Time: 12/15/2017 4:53 AM Performed by: Anselm PancoastJoy, Anamari Galeas C, PA-C Authorized by: Anselm PancoastJoy, Arianie Couse C, PA-C   Consent:    Consent obtained:  Verbal   Consent given by:  Patient   Risks discussed:  Bleeding, incomplete drainage and pain Location:    Type:  Abscess   Size:  2cm   Location:  Upper extremity   Upper extremity location:  Arm   Arm location:  L upper arm (Axilla) Pre-procedure details:    Skin preparation:  Betadine Anesthesia (see MAR for exact dosages):    Anesthesia method:  Local infiltration   Local anesthetic:  Lidocaine 2% WITH epi Procedure type:    Complexity:  Simple Procedure details:    Incision types:  Cruciate   Incision depth:  Subcutaneous   Scalpel blade:  11   Wound management:  Irrigated with saline and extensive cleaning   Drainage:  Purulent   Drainage amount:  Moderate   Wound treatment:  Wound left open Post-procedure details:    Patient tolerance of procedure:  Tolerated well, no immediate complications  US bedside Date/Time: 12/15/2017 4:40 AM Performed by: Anselm PancoastJoy, Benjamin Merrihew C, PA-C Authorized by: Anselm PancoastJoy, Adamaris King C, PA-C  Consent: Verbal consent obtained. Risks and benefits: risks, benefits and alternatives were discussed Consent given by: patient Patient understanding: patient states understanding of the procedure being performed Patient identity confirmed: verbally with patient and provided demographic data Local anesthesia used: no  Anesthesia: Local anesthesia used: no  Sedation: Patient sedated: no  Patient tolerance: Patient tolerated the procedure well with no immediate complications     EMERGENCY DEPARTMENT US SOFT TISSUE INTERPRETATION "Study: Limited Soft Tissue Ultrasound"  INDICATIONS: Pain and Soft tissue infection Multiple views of the body part were obtained in real-time with a multi-frequency linear probe  PERFORMED BY: Myself IMAGES ARCHIVED?: Yes SIDE:Left BODY PART:Axilla INTERPRETATION:  Abcess  present      Anselm PancoastJoy, Shameek Nyquist C, PA-C 12/15/17 0507    Shon BatonHorton, Courtney F, MD 12/15/17 [phone]

## 2018-01-15 ENCOUNTER — Ambulatory Visit (HOSPITAL_COMMUNITY)
Admission: EM | Admit: 2018-01-15 | Discharge: 2018-01-15 | Disposition: A | Discharge status: Home / Self Care | Payer: Medicaid Other | Attending: Family Medicine | Admitting: Family Medicine

## 2018-01-15 ENCOUNTER — Other Ambulatory Visit: Payer: Self-pay

## 2018-01-15 ENCOUNTER — Encounter (HOSPITAL_COMMUNITY): Payer: Self-pay | Admitting: Emergency Medicine

## 2018-01-15 DIAGNOSIS — H938X1 Other specified disorders of right ear: Secondary | ICD-10-CM | POA: Diagnosis not present

## 2018-01-15 DIAGNOSIS — H9201 Otalgia, right ear: Secondary | ICD-10-CM

## 2018-01-15 DIAGNOSIS — Z3202 Encounter for pregnancy test, result negative: Secondary | ICD-10-CM

## 2018-01-15 LAB — POCT PREGNANCY, URINE: Preg Test, Ur: NEGATIVE

## 2018-01-15 MED ORDER — NAPROXEN 500 MG PO TABS: 500.0000 mg | ORAL_TABLET | Freq: Two times a day (BID) | ORAL | 0 refills | Status: DC

## 2018-01-15 MED ORDER — MUPIROCIN CALCIUM 2 % EX CREA: 1.0000 "application " | TOPICAL_CREAM | Freq: Two times a day (BID) | CUTANEOUS | 0 refills | Status: AC

## 2018-01-15 NOTE — Discharge Instructions (Addendum)
Pregnancy  test was negative We will treat the sore in your ear with Bactroban cream You can also do warm compresses to the ear for 10 to 15 minutes a few times a day This may help soften the bump up and possibly drain You can take naproxen twice daily for pain Follow up as needed for continued or worsening symptoms

## 2018-01-15 NOTE — ED Triage Notes (Signed)
Pt here for right ear pain x3 days.  Pt denies any other URI symptoms.  Pt is also wanting a pregnancy test today.

## 2018-01-18 NOTE — ED Provider Notes (Signed)
[facility]    CSN: [id number] Arrival date & time: 01/15/18  1428     History   Chief Complaint Chief Complaint  Patient presents with  . Otalgia    Right  . Possible Pregnancy    Test    HPI Toni Ruiz is a 22 y.o. female.   Pt is a 22 year old female that is here for multiple complaints. She has bump to the inside of ear that has been there for a couple of days. It is painful. Symptoms have been constant and remained the same. She denies drainage from the area. She hasn't done anything to treat her symptoms. She denies any associated fever, chill, body aches, fatigue.   Pt is also here wanting pregnancy test. Her LMP was 12/14/17 and normal. She denies any pregnancy type symptoms. She denies any vaginal discharge, bleeding, abd pain, pelvic pain or dysuria.      Past Medical History:  Diagnosis Date  . Migraine     There are no active problems to display for this patient.   Past Surgical History:  Procedure Laterality Date  . CESAREAN SECTION      OB History   None      Home Medications    Prior to Admission medications   Medication Sig Start Date End Date Taking? Authorizing Provider  cyclobenzaprine (FLEXERIL) 5 MG tablet Take 1 tablet (5 mg total) by mouth 3 (three) times daily as needed for muscle spasms. 10/12/17  Yes Eustace Moore, MD  ibuprofen (ADVIL,MOTRIN) 800 MG tablet Take 800 mg by mouth every 8 (eight) hours as needed (for headaches or pain).    Yes [provider]  amitriptyline (ELAVIL) 25 MG tablet Take 1 tablet (25 mg total) by mouth at bedtime. 10/12/17   Eustace Moore, MD  eletriptan (RELPAX) 20 MG tablet Take 1 tablet (20 mg total) by mouth as needed for migraine or headache. May repeat in 2 hours if headache persists or recurs. Patient taking differently: Take 20 mg by mouth as needed for headache (for migraines and may repeat once in 2 hours, if no headache persists or recurs).  10/12/17   Eustace Moore, MD  methylPREDNISolone (MEDROL DOSEPAK) 4 MG TBPK tablet Take as directed 12/11/17   Lurline Idol, FNP  metroNIDAZOLE (FLAGYL) 500 MG tablet Take 1 tablet (500 mg total) by mouth 2 (two) times daily. Patient not taking: Reported on 12/15/2017 11/06/17   Eustace Moore, MD  mupirocin cream (BACTROBAN) 2 % Apply 1 application topically 2 (two) times daily. 01/15/18   Dahlia Byes A, NP  naproxen (NAPROSYN) 500 MG tablet Take 1 tablet (500 mg total) by mouth 2 (two) times daily with a meal. 01/15/18   Hellon Vaccarella A, NP  ondansetron (ZOFRAN) 4 MG tablet Take 1 tablet (4 mg total) by mouth every 6 (six) hours. Patient not taking: Reported on 12/15/2017 10/12/17   Eustace Moore, MD  rizatriptan (MAXALT) 10 MG tablet Take 10 mg by mouth as needed (for migraines and may repeat once in 2 hours, if no relief).     [provider]    Family History History reviewed. No pertinent family history.  Social History Social History   Tobacco Use  . Smoking status: Never Smoker  . Smokeless tobacco: Never Used  Substance Use Topics  . Alcohol use: Yes    Frequency: Never  . Drug use: Never     Allergies   Aspirin; Tylenol [acetaminophen]; and  Latex   Review of Systems Review of Systems   Physical Exam Triage Vital Signs ED Triage Vitals  Enc Vitals Group     BP 01/15/18 1459 115/76     Pulse Rate 01/15/18 1459 81     Resp 01/15/18 1459 16     Temp 01/15/18 1459 98.8 F (37.1 C)     Temp Source 01/15/18 1459 Oral     SpO2 01/15/18 1459 99 %     Weight --      Height --      Head Circumference --      Peak Flow --      Pain Score 01/15/18 1525 6     Pain Loc --      Pain Edu? --      Excl. in GC? --    No data found.  Updated Vital Signs BP 115/76 (BP Location: Left Arm)   Pulse 81   Temp 98.8 F (37.1 C) (Oral)   Resp 16   LMP 12/14/2017 (Exact Date)   SpO2 99%   Visual Acuity Right Eye Distance:   Left Eye Distance:   Bilateral Distance:     Right Eye Near:   Left Eye Near:    Bilateral Near:     Physical Exam  Constitutional: She is oriented to person, place, and time. She appears well-developed and well-nourished.  Very pleasant. Non toxic or ill appearing.   HENT:  Head: Normocephalic and atraumatic.  Nose: Nose normal.  Mouth/Throat: Oropharynx is clear and moist.  Small bump noted to right ear near tragus. Tender to touch. slightly erythematous. No drainage. No fluctuance.  Bilateral TMs normal.   Eyes: Conjunctivae are normal.  Neck: Normal range of motion.  Cardiovascular: Normal rate, regular rhythm and normal heart sounds.  Pulmonary/Chest: Effort normal and breath sounds normal.  Abdominal: Soft. Bowel sounds are normal.  Abdomen soft, non tender. No CVA tenderness. No rebound tenderness.   Musculoskeletal: Normal range of motion.  Neurological: She is alert and oriented to person, place, and time.  Skin: Skin is warm and dry.  Psychiatric: She has a normal mood and affect.  Nursing note and vitals reviewed.    UC Treatments / Results  Labs (all labs ordered are listed, but only abnormal results are displayed) Labs Reviewed  POCT PREGNANCY, URINE    EKG None  Radiology No results found.  Procedures Procedures (including critical care time)  Medications Ordered in UC Medications - No data to display  Initial Impression / Assessment and Plan / UC Course  I have reviewed the triage vital signs and the nursing notes.  Pertinent labs & imaging results that were available during my care of the patient were reviewed by me and considered in my medical decision making (see chart for details).     We will do warm compresses to the abscess in the ear. Bactroban cream Naproxen for pain Monitor for worsening symptoms.  Pregnancy test negative Follow up as needed for continued or worsening symptoms  Final Clinical Impressions(s) / UC Diagnoses   Final diagnoses:  Right ear pain  Pregnancy  test negative     Discharge Instructions     Pregnancy  test was negative We will treat the sore in your ear with Bactroban cream You can also do warm compresses to the ear for 10 to 15 minutes a few times a day This may help soften the bump up and possibly drain You can take naproxen twice daily for pain  Follow up as needed for continued or worsening symptoms     ED Prescriptions    Medication Sig Dispense Auth. Provider   naproxen (NAPROSYN) 500 MG tablet Take 1 tablet (500 mg total) by mouth 2 (two) times daily with a meal. 30 tablet Jamarcus Laduke A, NP   mupirocin cream (BACTROBAN) 2 % Apply 1 application topically 2 (two) times daily. 15 g Dahlia Byes A, NP     Controlled Substance Prescriptions [place] Controlled Substance Registry consulted? Not Applicable   Janace Aris, NP 01/18/18 1047

## 2018-03-09 ENCOUNTER — Encounter (HOSPITAL_COMMUNITY): Payer: Self-pay | Admitting: Emergency Medicine

## 2018-03-09 ENCOUNTER — Other Ambulatory Visit: Payer: Self-pay

## 2018-03-09 ENCOUNTER — Ambulatory Visit (HOSPITAL_COMMUNITY)
Admission: EM | Admit: 2018-03-09 | Discharge: 2018-03-09 | Disposition: A | Discharge status: Home / Self Care | Payer: Medicaid Other | Attending: Physician Assistant | Admitting: Physician Assistant

## 2018-03-09 DIAGNOSIS — R0789 Other chest pain: Secondary | ICD-10-CM | POA: Insufficient documentation

## 2018-03-09 MED ORDER — METRONIDAZOLE 0.75 % VA GEL: 1.0000 | Freq: Two times a day (BID) | VAGINAL | 0 refills | Status: AC

## 2018-03-09 MED ORDER — ESCITALOPRAM OXALATE 5 MG PO TABS: 5.0000 mg | ORAL_TABLET | Freq: Every day | ORAL | 0 refills | Status: AC

## 2018-03-09 NOTE — ED Triage Notes (Addendum)
PT reports a history of heart problems. [facility]. No history of PE per PT. PT reports left sided chest pain and pain with breathing for 1.5 hours. PT asks for anxiety medicine, her heart doctor would not give her anxiety medicine.   Also requests testing for BV.   PT saw her heart doctor recently and was told her "blood was low" PT was prescribed iron, but has not started taking it.

## 2018-03-09 NOTE — Discharge Instructions (Addendum)
You could consider asking your primary care provider to test you for sickle cell disease.  This can cause transient chest pain.  This may also be cervical angina which is quite common and a neck x-ray may shed some light here as well.  I am treating you for an anxiety related disorder with an SSRI called Lexapro.  The maximum dose of this medication is 20 mg and I am prescribing 5 mg.  Please get back in with your primary care provider in the next 2 to 3 weeks so you can see how this medication is affecting the and the dose can be adjusted at that time.

## 2018-03-09 NOTE — ED Provider Notes (Signed)
03/09/2018 3:56 PM   DOB: Jul 09, 1995 / MRN: [id number]  SUBJECTIVE:  Toni Ruiz is a 22 y.o. female presenting for chest pain.  She is a non-smoker and has no history of diabetes, hypertension, dyslipidemia.  She had a negative work-up at [facility] earlier this year by cardiology.  Reports that she has been having chest pain for quite a long time.  Pain tends to be left-sided and sometimes with a radicular pattern into the left shoulder.  She associates neck pain with this as well.  She takes ibuprofen 800 mg and this seems to help.  She denies shortness of breath, DOE, nausea diaphoresis, dizziness.  She does associate an increase in chest pain when she feels increases in emotional stress.  She has a history of anxiety that has required benzodiazepine therapy.   From her cardiology provider at [facility] as of Jan 19: Atypical chest pain however with no risk factors, and essentially benign ER evaluation and normal EKG today, some reproducible component as well, we'll proceed with a treadmill stress test and an echocardiogram to look for any structural abnormalities and for any ischemic substrate although suspect that would be unlikely and most of her symptoms are either musculoskeletal or mediated by anxiety. She will need to follow-up with the family physician's if these tests are normal.   She is allergic to aspirin; tylenol [acetaminophen]; and latex.   She  has a past medical history of Migraine.    She  reports that she has never smoked. She has never used smokeless tobacco. She reports that she drinks alcohol. She reports that she does not use drugs. She  has no sexual activity history on file. The patient  has a past surgical history that includes Cesarean section.  Her family history is not on file.  ROS HPI  OBJECTIVE:  BP 116/76   Pulse 94   Temp 98.2 F (36.8 C)   Resp 16   LMP 02/16/2018   SpO2 100%   Wt Readings from Last 3 Encounters:  12/14/17 127 lb (57.6 kg)   Temp Readings  from Last 3 Encounters:  03/09/18 98.2 F (36.8 C)  01/15/18 98.8 F (37.1 C) (Oral)  12/15/17 98.8 F (37.1 C) (Oral)   BP Readings from Last 3 Encounters:  03/09/18 116/76  01/15/18 115/76  12/15/17 (!) 117/54   Pulse Readings from Last 3 Encounters:  03/09/18 94  01/15/18 81  12/15/17 73    Physical Exam  Constitutional: She is oriented to person, place, and time. She appears well-nourished. No distress.  Eyes: Pupils are equal, round, and reactive to light. EOM are normal.  Cardiovascular: Normal rate, regular rhythm and normal pulses.  Pulmonary/Chest: Effort normal and breath sounds normal.  Some reproducible tenderness about the chest at the left sternal border.  Abdominal: She exhibits no distension.  Neurological: She is alert and oriented to person, place, and time. No cranial nerve deficit. Gait normal.  Skin: Skin is dry. She is not diaphoretic.  Psychiatric: She has a normal mood and affect.  Vitals reviewed.   No results found for this or any previous visit (from the past 72 hour(s)).  No results found.  ASSESSMENT AND PLAN:   Atypical chest pain    Discharge Instructions     You could consider asking your primary care provider to test you for sickle cell disease.  This can cause transient chest pain.  This may also be cervical angina which is quite common and a neck x-ray may shed  some light here as well.  I am treating you for an anxiety related disorder with an SSRI called Lexapro.  The maximum dose of this medication is 20 mg and I am prescribing 5 mg.  Please get back in with your primary care provider in the next 2 to 3 weeks so you can see how this medication is affecting the and the dose can be adjusted at that time.        The patient is advised to call or return to clinic if she does not see an improvement in symptoms, or to seek the care of the closest emergency department if she worsens with the above plan.   Deliah BostonMichael Clark, MHS,  PA-C 03/09/2018 3:56 PM   Ofilia Neaslark, Michael L, PA-C 03/09/18 1556

## 2018-04-23 ENCOUNTER — Encounter (HOSPITAL_COMMUNITY): Payer: Self-pay | Admitting: Emergency Medicine

## 2018-04-23 ENCOUNTER — Ambulatory Visit (HOSPITAL_COMMUNITY)
Admission: EM | Admit: 2018-04-23 | Discharge: 2018-04-23 | Disposition: A | Discharge status: Home / Self Care | Payer: Medicaid Other | Attending: Family Medicine | Admitting: Family Medicine

## 2018-04-23 DIAGNOSIS — R1084 Generalized abdominal pain: Secondary | ICD-10-CM | POA: Insufficient documentation

## 2018-04-23 DIAGNOSIS — Z3202 Encounter for pregnancy test, result negative: Secondary | ICD-10-CM

## 2018-04-23 DIAGNOSIS — N898 Other specified noninflammatory disorders of vagina: Secondary | ICD-10-CM | POA: Insufficient documentation

## 2018-04-23 DIAGNOSIS — L309 Dermatitis, unspecified: Secondary | ICD-10-CM | POA: Diagnosis not present

## 2018-04-23 LAB — POCT URINALYSIS DIP (DEVICE)
Bilirubin Urine: NEGATIVE
GLUCOSE, UA: NEGATIVE mg/dL
KETONES UR: NEGATIVE mg/dL
Nitrite: NEGATIVE
PH: 7 (ref 5.0–8.0)
Protein, ur: NEGATIVE mg/dL
SPECIFIC GRAVITY, URINE: 1.015 (ref 1.005–1.030)
UROBILINOGEN UA: 1 mg/dL (ref 0.0–1.0)

## 2018-04-23 MED ORDER — TRIAMCINOLONE ACETONIDE 0.1 % EX CREA: 1.0000 "application " | TOPICAL_CREAM | Freq: Two times a day (BID) | CUTANEOUS | 0 refills | Status: AC

## 2018-04-23 MED ORDER — METRONIDAZOLE 500 MG PO TABS: 500.0000 mg | ORAL_TABLET | Freq: Two times a day (BID) | ORAL | 0 refills | Status: AC

## 2018-04-23 NOTE — ED Provider Notes (Addendum)
[facility]    CSN: [id number] Arrival date & time: 04/23/18  1428     History   Chief Complaint Chief Complaint  Patient presents with  . Appointment    245  . Pelvic Pain    HPI Toni Ruiz is a 23 y.o. female no significant past medical history presenting today for evaluation of abdominal pain.  Patient states that over the past week she has had lower abdominal discomfort.  Symptoms began Sunday after she ended her menstrual cycle.  Since she has had some occasional spotting as well as noted some white discharge.  She has had some nausea, but denies vomiting.  States that she has a history of BV and was recently treated with the gel which she does not believe fully resolved her symptoms.  She denies any specific concerns for STDs and denies any new partners.  She is not on any form of birth control.  Denies associated chest pain or shortness of breath.  Eating and drinking relatively normal despite pain.  Patient also noting that she has a history of eczema and has frequent areas of dry erythematous lesions and is hoping to receive a cream for this.  HPI  Past Medical History:  Diagnosis Date  . Migraine     There are no active problems to display for this patient.   Past Surgical History:  Procedure Laterality Date  . CESAREAN SECTION      OB History   No obstetric history on file.      Home Medications    Prior to Admission medications   Medication Sig Start Date End Date Taking? Authorizing Provider  cyclobenzaprine (FLEXERIL) 5 MG tablet Take 1 tablet (5 mg total) by mouth 3 (three) times daily as needed for muscle spasms. 10/12/17  Yes Eustace Moore, MD  amitriptyline (ELAVIL) 25 MG tablet Take 1 tablet (25 mg total) by mouth at bedtime. 10/12/17   Eustace Moore, MD  escitalopram (LEXAPRO) 5 MG tablet Take 1 tablet (5 mg total) by mouth daily. 03/09/18   Ofilia Neas, PA-C  methylPREDNISolone (MEDROL DOSEPAK) 4 MG TBPK tablet Take as  directed Patient not taking: Reported on 04/23/2018 12/11/17   Lurline Idol, FNP  metroNIDAZOLE (FLAGYL) 500 MG tablet Take 1 tablet (500 mg total) by mouth 2 (two) times daily for 7 days. 04/23/18 04/30/18  ,  C, PA-C  metroNIDAZOLE (METROGEL VAGINAL) 0.75 % vaginal gel Place 1 Applicatorful vaginally 2 (two) times daily. Patient not taking: Reported on 04/23/2018 03/09/18   Ofilia Neas, PA-C  mupirocin cream (BACTROBAN) 2 % Apply 1 application topically 2 (two) times daily. 01/15/18   Dahlia Byes A, NP  rizatriptan (MAXALT) 10 MG tablet Take 10 mg by mouth as needed (for migraines and may repeat once in 2 hours, if no relief).     [provider]  triamcinolone cream (KENALOG) 0.1 % Apply 1 application topically 2 (two) times daily. 04/23/18   , Junius Creamer, PA-C    Family History No family history on file.  Social History Social History   Tobacco Use  . Smoking status: Never Smoker  . Smokeless tobacco: Never Used  Substance Use Topics  . Alcohol use: Yes    Frequency: Never  . Drug use: Never     Allergies   Aspirin; Tylenol [acetaminophen]; and Latex   Review of Systems Review of Systems  Constitutional: Negative for fever.  Respiratory: Negative for shortness of breath.   Cardiovascular: Negative for  chest pain.  Gastrointestinal: Positive for abdominal pain. Negative for diarrhea, nausea and vomiting.  Genitourinary: Positive for vaginal discharge. Negative for dysuria, flank pain, genital sores, hematuria, menstrual problem, vaginal bleeding and vaginal pain.  Musculoskeletal: Negative for back pain.  Skin: Negative for rash.  Neurological: Negative for dizziness, light-headedness and headaches.     Physical Exam Triage Vital Signs ED Triage Vitals  Enc Vitals Group     BP 04/23/18 1501 122/66     Pulse Rate 04/23/18 1501 81     Resp 04/23/18 1501 17     Temp 04/23/18 1501 98.2 F (36.8 C)     Temp src --      SpO2 04/23/18  1501 100 %     Weight --      Height --      Head Circumference --      Peak Flow --      Pain Score 04/23/18 1503 3     Pain Loc --      Pain Edu? --      Excl. in GC? --    No data found.  Updated Vital Signs BP 122/66   Pulse 81   Temp 98.2 F (36.8 C)   Resp 17   LMP 04/18/2018   SpO2 100%   Visual Acuity Right Eye Distance:   Left Eye Distance:   Bilateral Distance:    Right Eye Near:   Left Eye Near:    Bilateral Near:     Physical Exam Vitals signs and nursing note reviewed.  Constitutional:      General: She is not in acute distress.    Appearance: She is well-developed.  HENT:     Head: Normocephalic and atraumatic.  Eyes:     Conjunctiva/sclera: Conjunctivae normal.  Neck:     Musculoskeletal: Neck supple.  Cardiovascular:     Rate and Rhythm: Normal rate and regular rhythm.     Heart sounds: No murmur.  Pulmonary:     Effort: Pulmonary effort is normal. No respiratory distress.     Breath sounds: Normal breath sounds.  Abdominal:     Palpations: Abdomen is soft.     Tenderness: There is no abdominal tenderness.     Comments: Mild tenderness to bilateral lower quadrants of abdomen, negative rebound, negative Rovsing, negative McBurney's  Genitourinary:    Comments: Normal external female genitalia, normal pink vaginal lining, cervix nonerythematous, moderate amount of white milky discharge with various thicker aspects of the discharge Skin:    General: Skin is warm and dry.  Neurological:     Mental Status: She is alert.      UC Treatments / Results  Labs (all labs ordered are listed, but only abnormal results are displayed) Labs Reviewed  POCT URINALYSIS DIP (DEVICE) - Abnormal; Notable for the following components:      Result Value   Hgb urine dipstick TRACE (*)    Leukocytes, UA MODERATE (*)    All other components within normal limits  URINE CULTURE  CERVICOVAGINAL ANCILLARY ONLY    EKG None  Radiology No results  found.  Procedures Procedures (including critical care time)  Medications Ordered in UC Medications - No data to display  Initial Impression / Assessment and Plan / UC Course  I have reviewed the triage vital signs and the nursing notes.  Pertinent labs & imaging results that were available during my care of the patient were reviewed by me and considered in my medical decision making (see  chart for details).     Negative pregnancy, moderate leuks, will send off for culture to confirm.  Will initiate treatment for BV given her significant history.  Vaginal swab obtained and will send off to check for other causes of discharge.  Initiating treatment with metronidazole today empirically.  Tylenol and ibuprofen for abdominal pain in the meantime.Discussed strict return precautions. Patient verbalized understanding and is agreeable with plan.  Kenalog prescribed for eczema.  Final Clinical Impressions(s) / UC Diagnoses   Final diagnoses:  Generalized abdominal pain  Vaginal discharge  Eczema, unspecified type     Discharge Instructions     We are sending the urine off to check for infection in the urine Please begin taking metronidazole twice daily for the next week to treat for BV.  We will send the swab off which will come back in a couple days and we will call you with the results and provide any further treatment if needed  In the meantime please take Tylenol or ibuprofen for your discomfort  Follow-up if symptoms not resolving or worsening   ED Prescriptions    Medication Sig Dispense Auth. Provider   metroNIDAZOLE (FLAGYL) 500 MG tablet Take 1 tablet (500 mg total) by mouth 2 (two) times daily for 7 days. 14 tablet ,  C, PA-C   triamcinolone cream (KENALOG) 0.1 % Apply 1 application topically 2 (two) times daily. 453.6 g Patterson Hammersmith,  C, PA-C     Controlled Substance Prescriptions [place] Controlled Substance Registry consulted? Not Applicable   Foy [place],   C, PA-C 04/23/18 2111    Lew Dawes,  C, [place] 04/23/18 2112

## 2018-04-23 NOTE — Discharge Instructions (Signed)
We are sending the urine off to check for infection in the urine Please begin taking metronidazole twice daily for the next week to treat for BV.  We will send the swab off which will come back in a couple days and we will call you with the results and provide any further treatment if needed  In the meantime please take Tylenol or ibuprofen for your discomfort  Follow-up if symptoms not resolving or worsening

## 2018-04-23 NOTE — ED Triage Notes (Signed)
Pt reports lower abd pain radiating upwards and to the left with intermittent nausea x3 days. States she finished period Sunday but continues spotting. Also reports white vaginal discharge since Sunday. States "they gave me something last month but it didn't work."

## 2018-04-26 LAB — URINE CULTURE: Culture: 10000 — AB

## 2018-04-26 LAB — CERVICOVAGINAL ANCILLARY ONLY
BACTERIAL VAGINITIS: POSITIVE — AB
CHLAMYDIA, DNA PROBE: NEGATIVE
Candida vaginitis: NEGATIVE
NEISSERIA GONORRHEA: NEGATIVE
Trichomonas: NEGATIVE

## 2018-05-01 ENCOUNTER — Other Ambulatory Visit (HOSPITAL_COMMUNITY): Payer: Self-pay | Admitting: Physician Assistant

## 2018-05-07 NOTE — Telephone Encounter (Signed)
Please Advise, pt needs to establish care. Provider not at this practice.

## 2019-12-19 IMAGING — CR DG CHEST 2V
2 series · 2 of 2 positions shown · non-contrast
Comparison: 12/10/2017

CLINICAL DATA: Chest pain

EXAM:
CHEST - 2 VIEW

[chest pa]
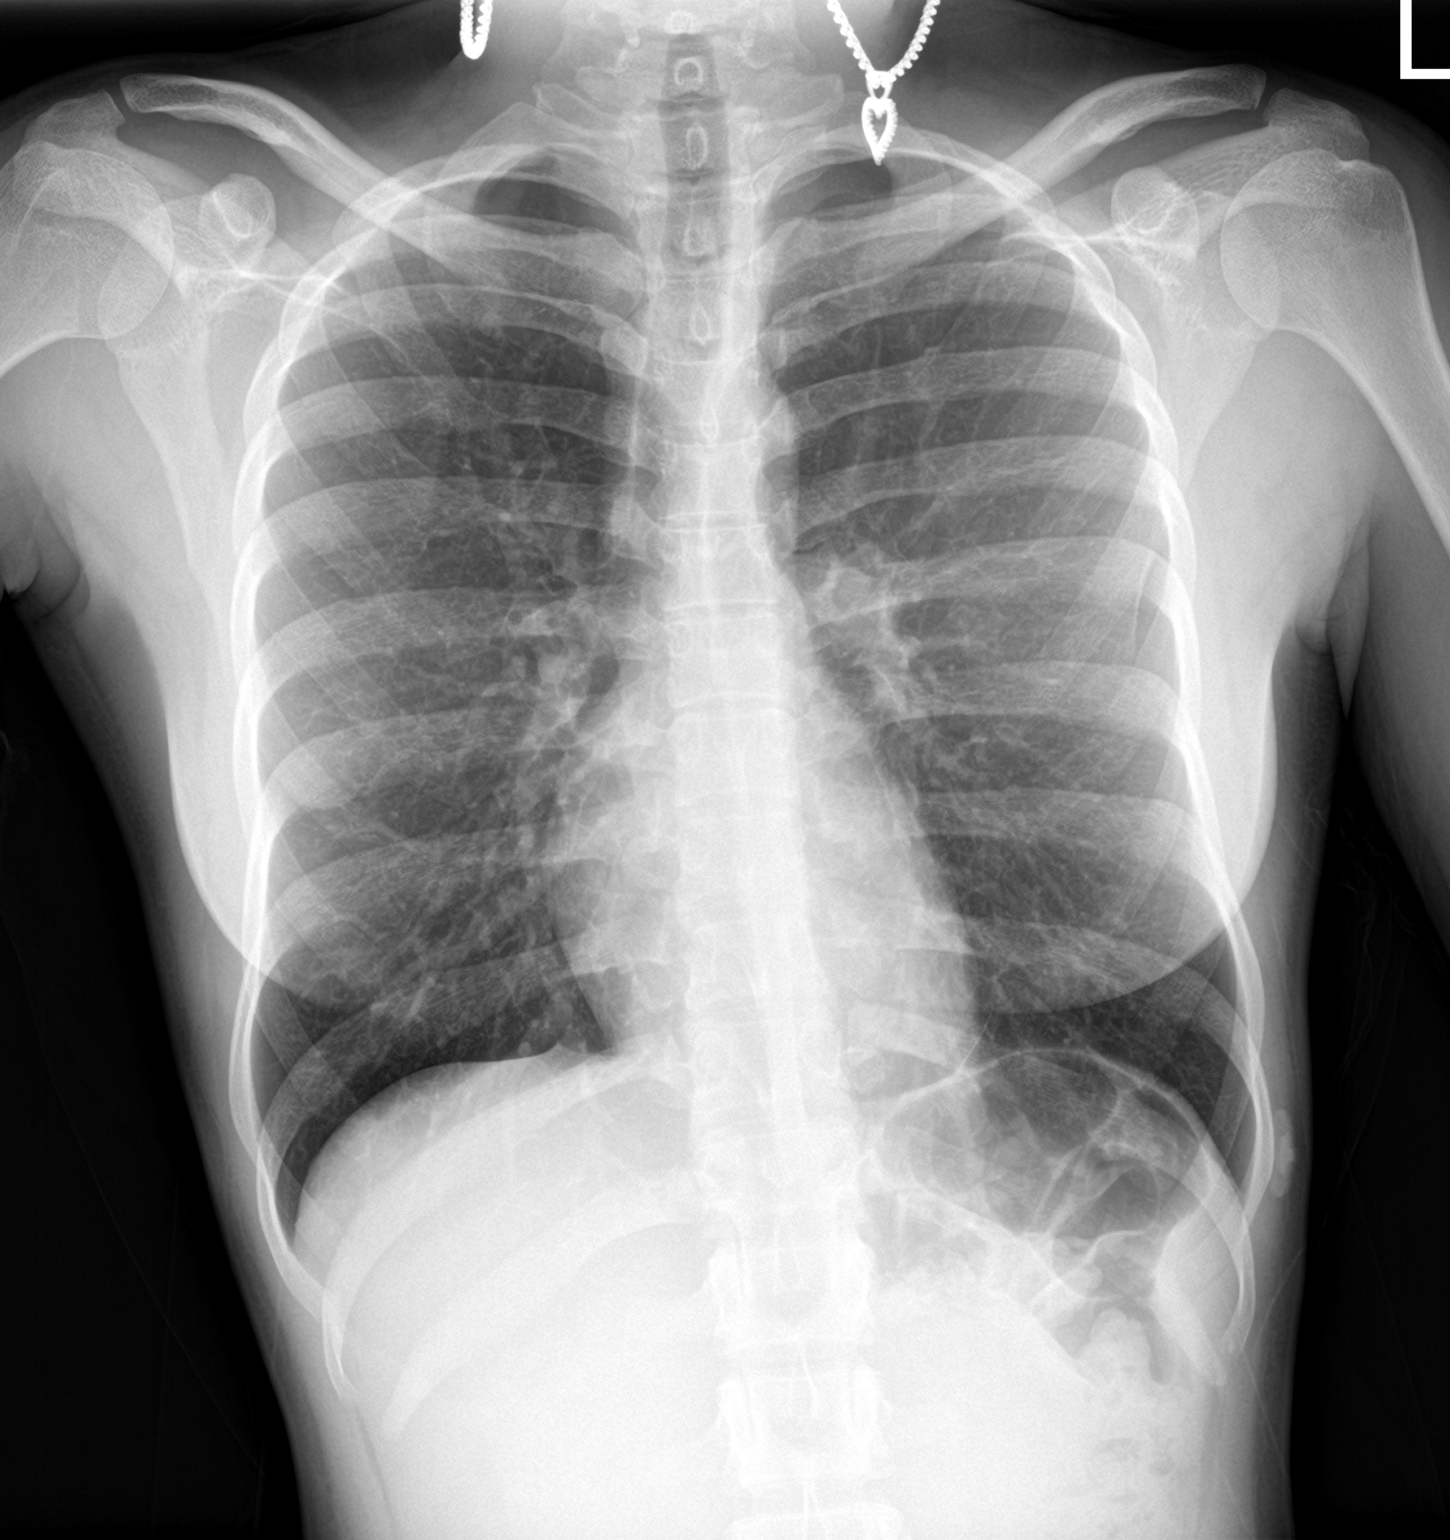

[chest lat]
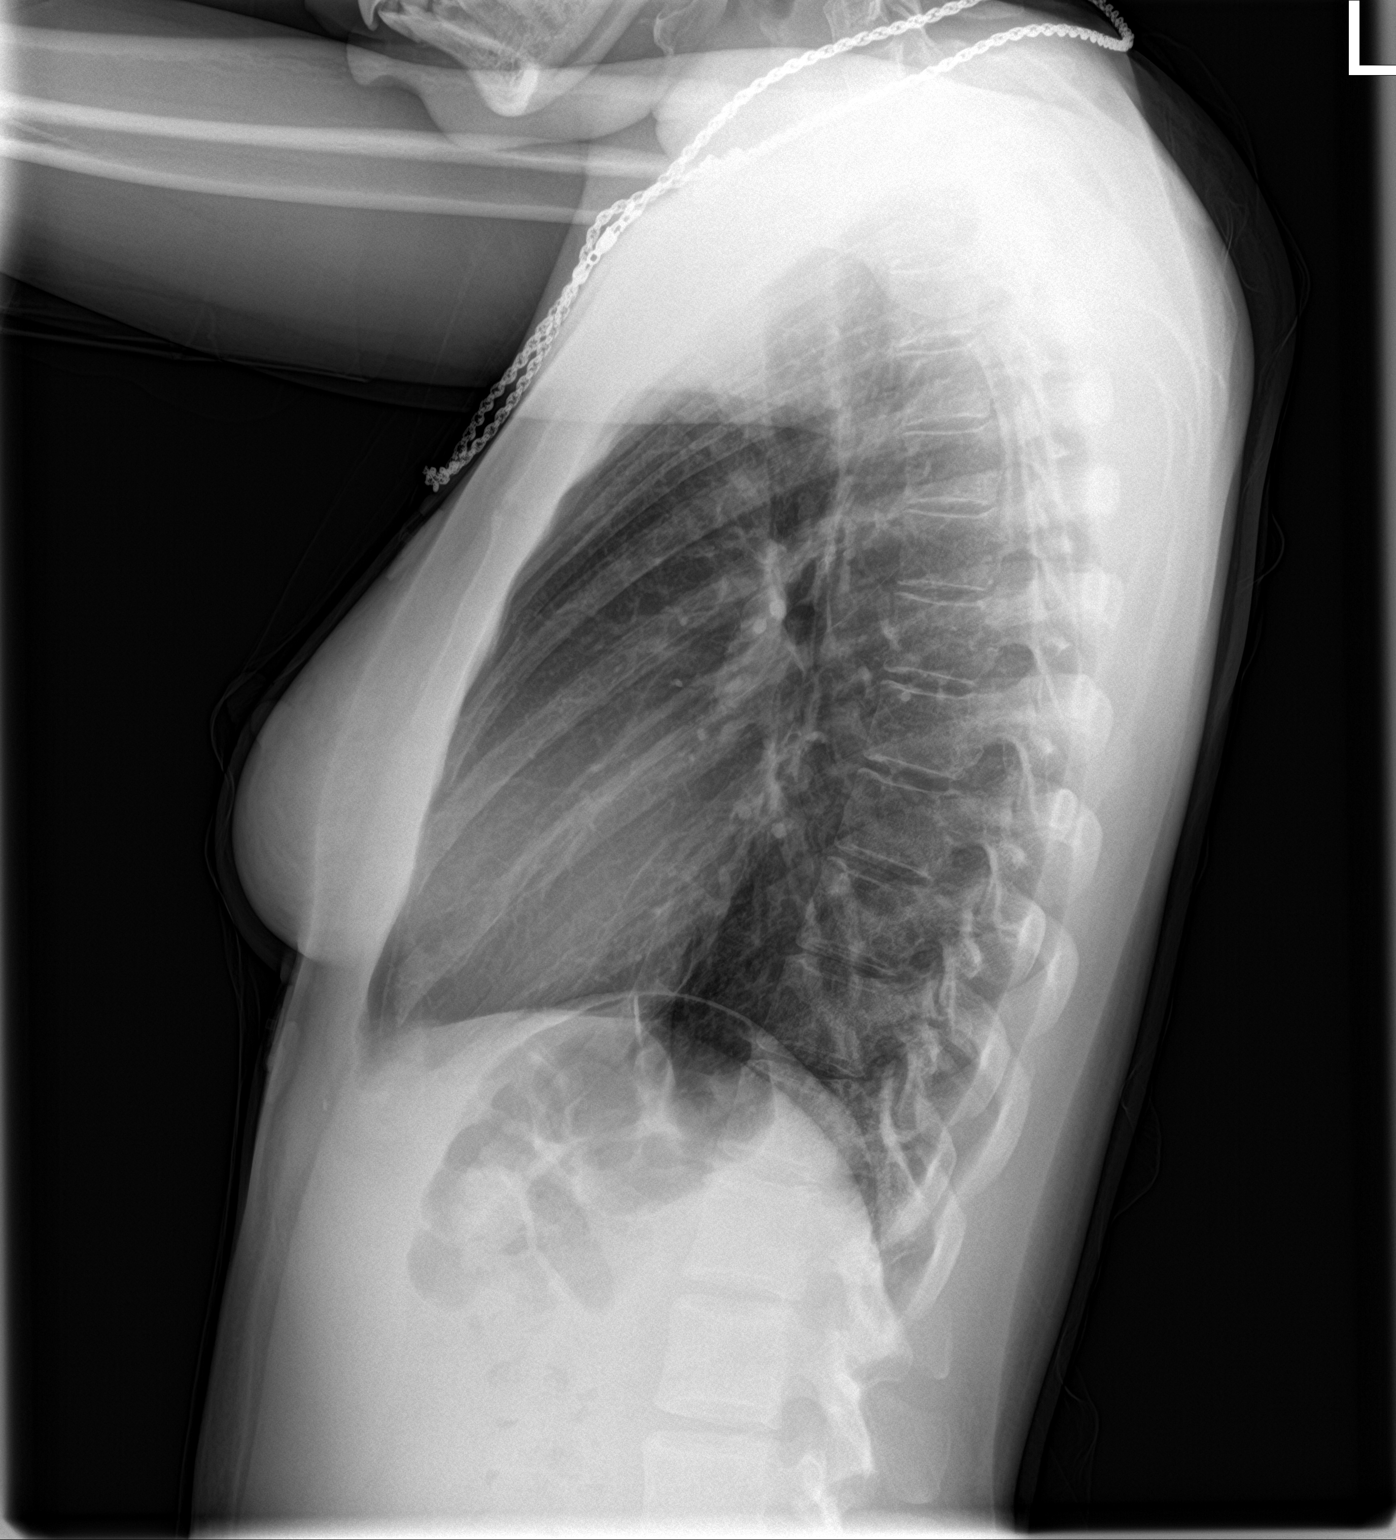

[2 of 2 positions shown; findings below may reference images not displayed]

FINDINGS: The heart size and mediastinal contours are within normal limits.
Both lungs are clear. The visualized skeletal structures are
unremarkable.
IMPRESSION: No active cardiopulmonary disease.

## 2019-12-20 IMAGING — CT CT ANGIO CHEST
2 of 7 series · 19 of 46 positions shown · IV contrast (APPLIED)
Comparison: None.

CLINICAL DATA: Positive D-dimer.

EXAM:
CT ANGIOGRAPHY CHEST WITH CONTRAST
TECHNIQUE: Multidetector CT imaging of the chest was performed using the
standard protocol during bolus administration of intravenous
contrast. Multiplanar CT image reconstructions and MIPs were
obtained to evaluate the vascular anatomy.
CONTRAST:  100mL [id number] IOPAMIDOL ([id number]) INJECTION 76%

[Series 7: thins · axial · 0.54mm/px · z∈[-296,-80]mm · 16 of 348 slices shown]
[im 20/348  lung]
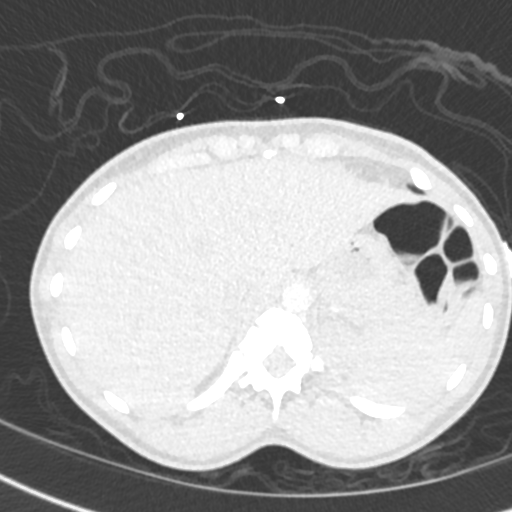
[im 39/348  soft-tissue]
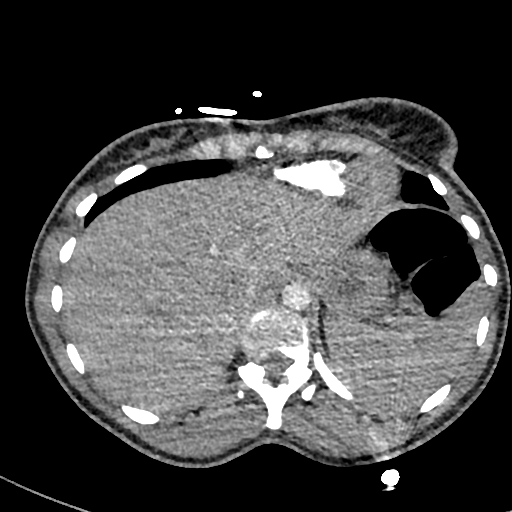
[im 58/348  lung]
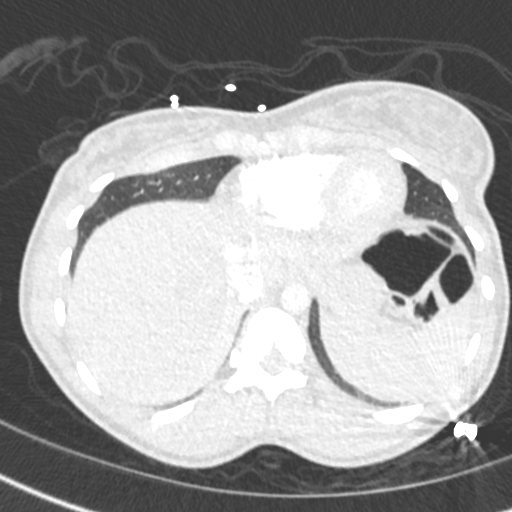
[im 78/348  soft-tissue]
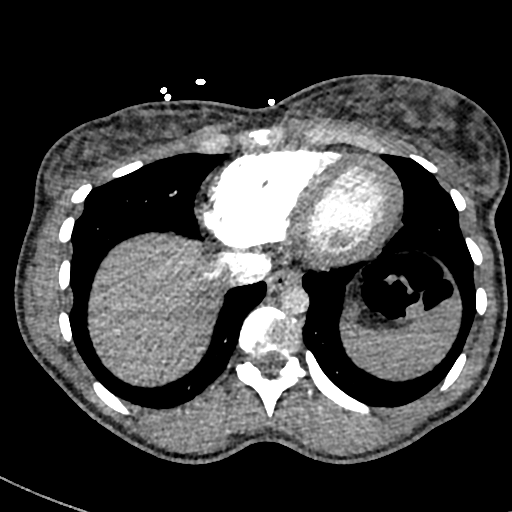
[im 97/348  lung]
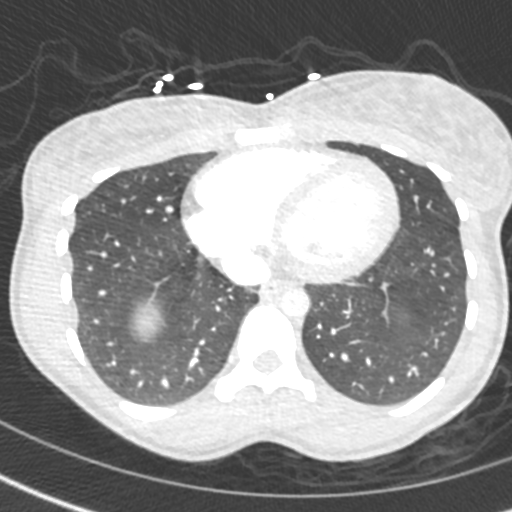
[im 116/348  soft-tissue]
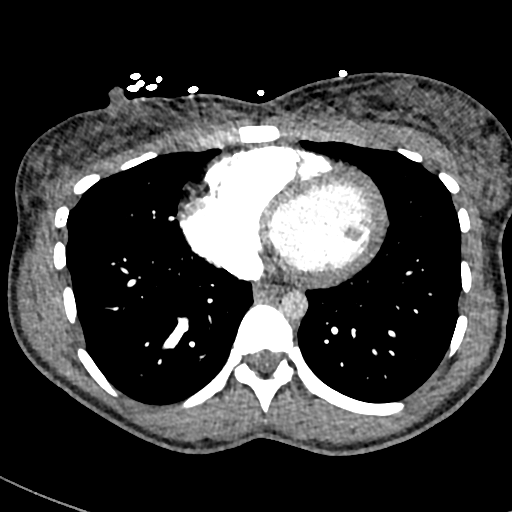
[im 135/348  lung]
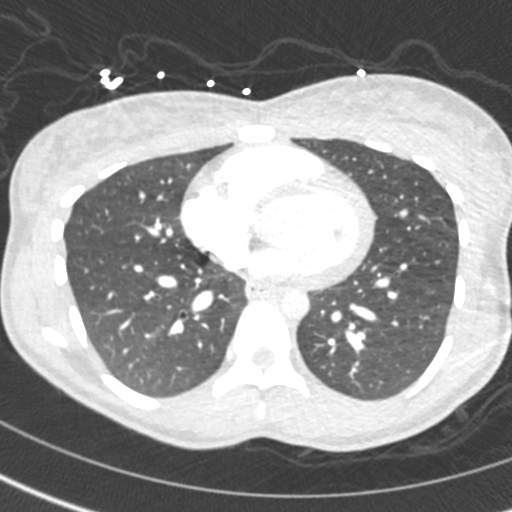
[im 155/348  soft-tissue]
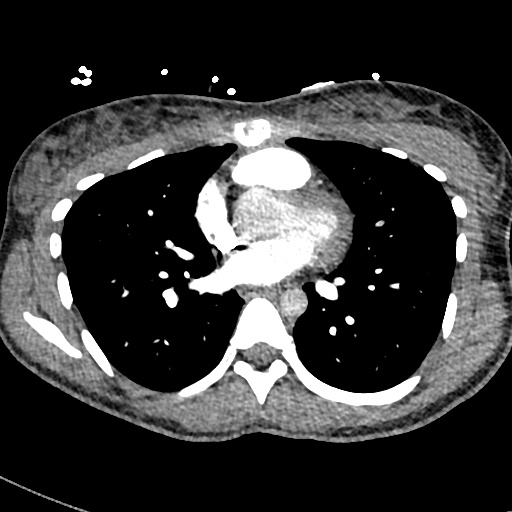
[im 193/348  lung]
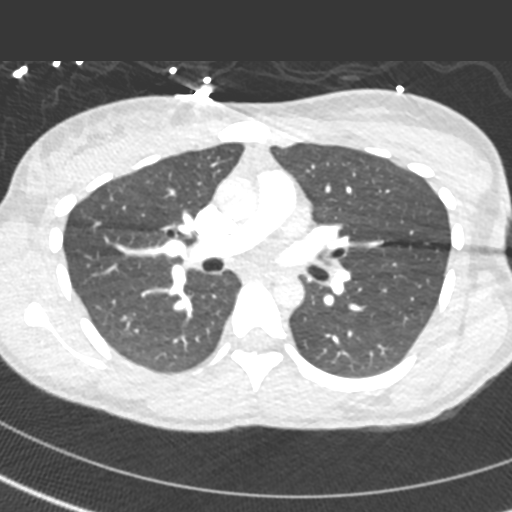
[im 213/348  soft-tissue]
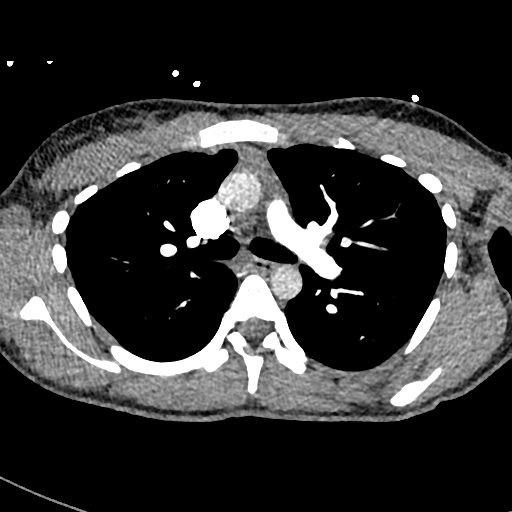
[im 232/348  lung]
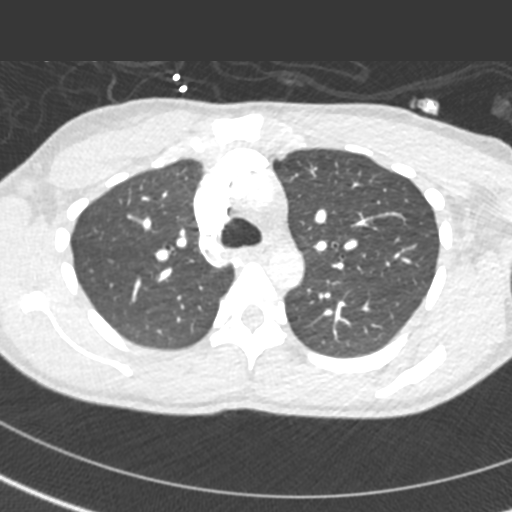
[im 251/348  soft-tissue]
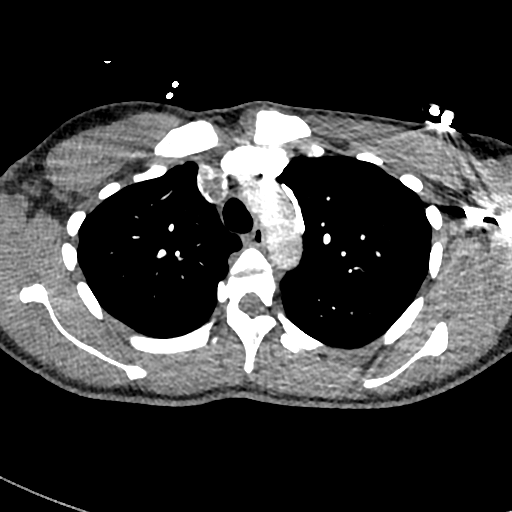
[im 270/348  lung]
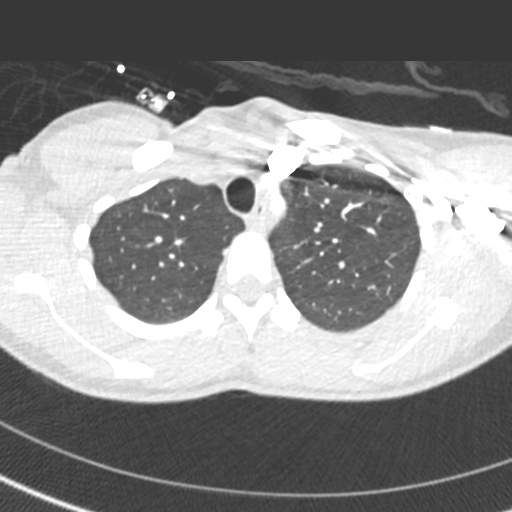
[im 290/348  soft-tissue]
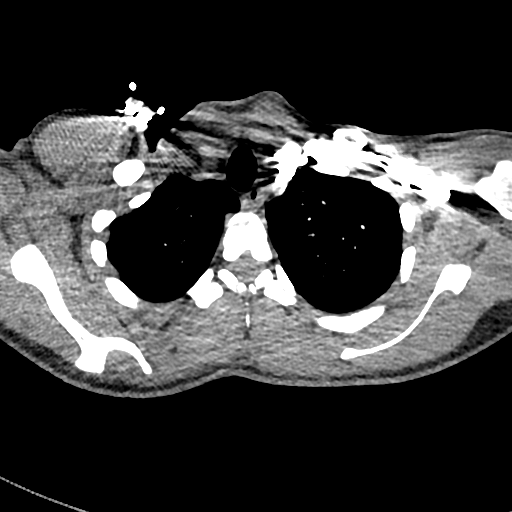
[im 309/348  lung]
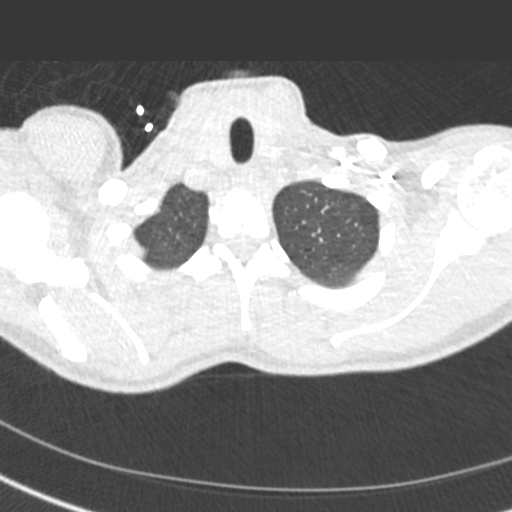
[im 328/348  soft-tissue]
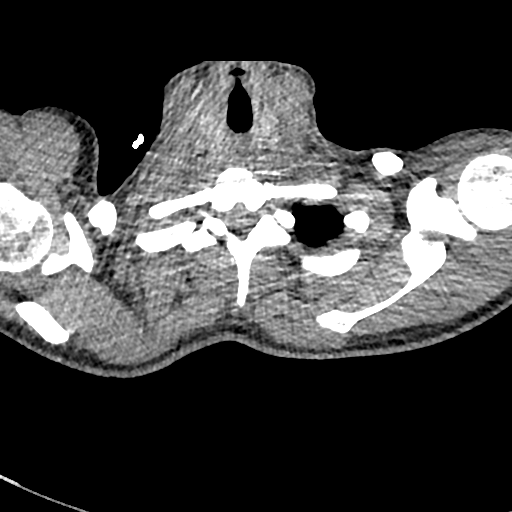

[Series 8: cor · coronal · 0.50mm/px · 3 of 133 slices shown]
[im 34/133  soft-tissue]
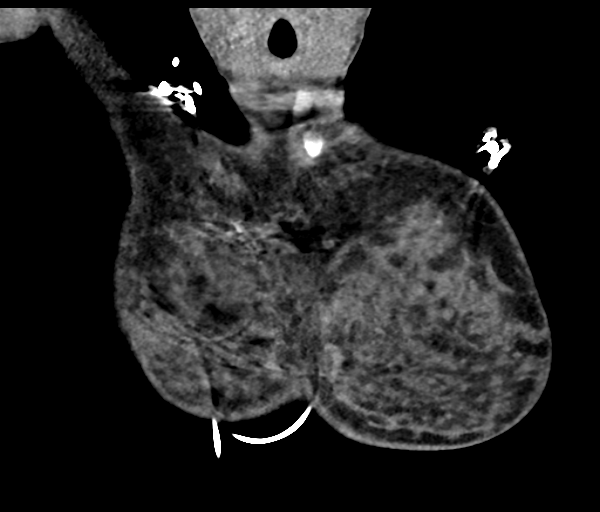
[im 67/133  soft-tissue]
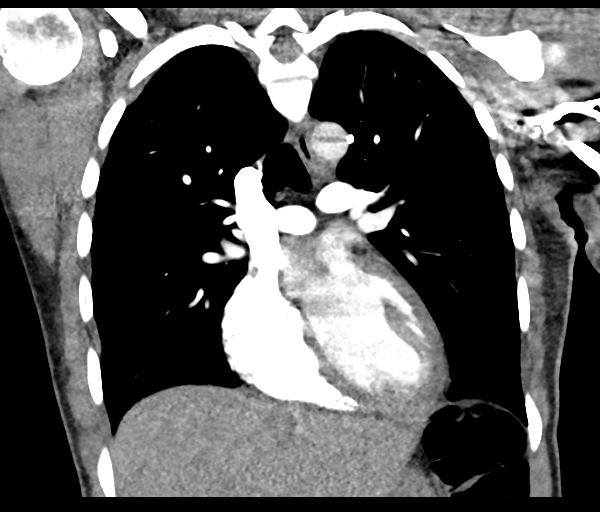
[im 100/133  soft-tissue]
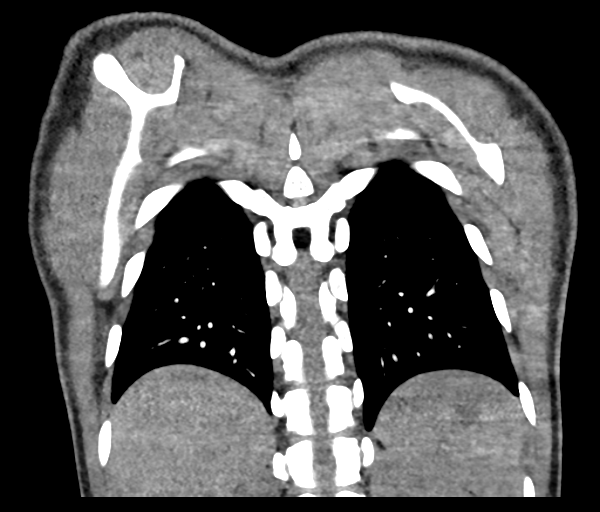

[19 of 46 positions shown; findings below may reference images not displayed]

FINDINGS: Cardiovascular: There is good opacification of the central and
segmental pulmonary arteries. No focal filling defects. No evidence
of significant pulmonary embolus. Normal heart size. No pericardial
effusion. Normal caliber thoracic aorta.

Mediastinum/Nodes: Esophagus is decompressed. No significant
mediastinal lymphadenopathy.

Lungs/Pleura: Lungs are clear and expanded. No airspace disease or
consolidation. No pleural effusions. No pneumothorax. Airways are
patent.

Upper Abdomen: No acute abnormality.

Musculoskeletal: No chest wall abnormality. No acute or significant
osseous findings.

Review of the MIP images confirms the above findings.
IMPRESSION: No evidence of significant pulmonary embolus. No evidence of active
pulmonary disease.
